# Patient Record
Sex: Male | Born: 1980 | Race: White | Hispanic: No | Marital: Married | Smoking: Former smoker
Health system: Southern US, Community
[De-identification: ages and names within clinical notes are randomized; demographics above are authoritative.]

## PROBLEM LIST (undated history)

## (undated) DIAGNOSIS — I319 Disease of pericardium, unspecified: Secondary | ICD-10-CM

---

## 2010-02-23 ENCOUNTER — Emergency Department: Payer: Self-pay | Admitting: Unknown Physician Specialty

## 2010-05-17 ENCOUNTER — Emergency Department: Payer: Self-pay | Admitting: Emergency Medicine

## 2010-11-13 ENCOUNTER — Emergency Department: Payer: Self-pay | Admitting: Emergency Medicine

## 2011-01-21 ENCOUNTER — Emergency Department: Payer: Self-pay | Admitting: *Deleted

## 2012-01-13 ENCOUNTER — Ambulatory Visit: Payer: Self-pay | Admitting: General Practice

## 2013-04-21 ENCOUNTER — Ambulatory Visit: Payer: Self-pay | Admitting: General Practice

## 2013-06-03 DIAGNOSIS — E221 Hyperprolactinemia: Secondary | ICD-10-CM | POA: Insufficient documentation

## 2013-06-03 DIAGNOSIS — E237 Disorder of pituitary gland, unspecified: Secondary | ICD-10-CM | POA: Insufficient documentation

## 2014-01-19 ENCOUNTER — Emergency Department: Payer: Self-pay | Admitting: Emergency Medicine

## 2014-01-19 LAB — CBC
HCT: 43.4 % (ref 40.0–52.0)
HGB: 14.1 g/dL (ref 13.0–18.0)
MCH: 29.1 pg (ref 26.0–34.0)
MCHC: 32.4 g/dL (ref 32.0–36.0)
MCV: 90 fL (ref 80–100)
Platelet: 211 10*3/uL (ref 150–440)
RBC: 4.84 10*6/uL (ref 4.40–5.90)
RDW: 13.5 % (ref 11.5–14.5)
WBC: 5.8 10*3/uL (ref 3.8–10.6)

## 2014-01-19 LAB — BASIC METABOLIC PANEL
ANION GAP: 8 (ref 7–16)
BUN: 12 mg/dL (ref 7–18)
Calcium, Total: 8.7 mg/dL (ref 8.5–10.1)
Chloride: 106 mmol/L (ref 98–107)
Co2: 26 mmol/L (ref 21–32)
Creatinine: 1.18 mg/dL (ref 0.60–1.30)
EGFR (African American): >60
EGFR (Non-African Amer.): >60
GLUCOSE: 112 mg/dL — AB (ref 65–99)
Osmolality: 280 (ref 275–301)
Potassium: 3.7 mmol/L (ref 3.5–5.1)
SODIUM: 140 mmol/L (ref 136–145)

## 2014-01-19 LAB — TROPONIN I: Troponin-I: <0.02 ng/mL

## 2014-12-09 ENCOUNTER — Emergency Department: Payer: PRIVATE HEALTH INSURANCE

## 2014-12-09 ENCOUNTER — Emergency Department
Admission: EM | Admit: 2014-12-09 | Discharge: 2014-12-10 | Disposition: A | Discharge status: Home / Self Care | Payer: PRIVATE HEALTH INSURANCE | Attending: Student | Admitting: Student

## 2014-12-09 ENCOUNTER — Encounter: Payer: Self-pay | Admitting: Emergency Medicine

## 2014-12-09 DIAGNOSIS — R51 Headache: Secondary | ICD-10-CM

## 2014-12-09 DIAGNOSIS — R519 Headache, unspecified: Secondary | ICD-10-CM

## 2014-12-09 DIAGNOSIS — G43909 Migraine, unspecified, not intractable, without status migrainosus: Secondary | ICD-10-CM | POA: Diagnosis not present

## 2014-12-09 HISTORY — DX: Disease of pericardium, unspecified: I31.9

## 2014-12-09 LAB — BASIC METABOLIC PANEL
ANION GAP: 6 (ref 5–15)
BUN: 13 mg/dL (ref 6–20)
CO2: 27 mmol/L (ref 22–32)
Calcium: 9.1 mg/dL (ref 8.9–10.3)
Chloride: 106 mmol/L (ref 101–111)
Creatinine, Ser: 1.14 mg/dL (ref 0.61–1.24)
GFR calc Af Amer: >60 mL/min (ref >60)
GLUCOSE: 140 mg/dL — AB (ref 65–99)
POTASSIUM: 3.9 mmol/L (ref 3.5–5.1)
Sodium: 139 mmol/L (ref 135–145)

## 2014-12-09 LAB — CBC
HEMATOCRIT: 47.1 % (ref 40.0–52.0)
Hemoglobin: 15.9 g/dL (ref 13.0–18.0)
MCH: 28.9 pg (ref 26.0–34.0)
MCHC: 33.7 g/dL (ref 32.0–36.0)
MCV: 85.7 fL (ref 80.0–100.0)
Platelets: 252 10*3/uL (ref 150–440)
RBC: 5.5 MIL/uL (ref 4.40–5.90)
RDW: 13.4 % (ref 11.5–14.5)
WBC: 6.3 10*3/uL (ref 3.8–10.6)

## 2014-12-09 MED ORDER — DIPHENHYDRAMINE HCL 50 MG/ML IJ SOLN
12.5000 mg | Freq: Once | INTRAMUSCULAR | Status: AC
  Administered 2014-12-09: 12.5 mg via INTRAVENOUS
  Filled 2014-12-09: qty 1

## 2014-12-09 MED ORDER — SODIUM CHLORIDE 0.9 % IV BOLUS (SEPSIS)
1000.0000 mL | Freq: Once | INTRAVENOUS | Status: AC
  Administered 2014-12-09: 1000 mL via INTRAVENOUS

## 2014-12-09 MED ORDER — METOCLOPRAMIDE HCL 5 MG/ML IJ SOLN
10.0000 mg | Freq: Once | INTRAMUSCULAR | Status: AC
  Administered 2014-12-09: 10 mg via INTRAVENOUS
  Filled 2014-12-09: qty 2

## 2014-12-09 MED ORDER — KETOROLAC TROMETHAMINE 30 MG/ML IJ SOLN
30.0000 mg | Freq: Once | INTRAMUSCULAR | Status: AC
  Administered 2014-12-09: 30 mg via INTRAVENOUS
  Filled 2014-12-09: qty 1

## 2014-12-09 NOTE — ED Notes (Signed)
Dr. Gayle at bedside  

## 2014-12-09 NOTE — ED Provider Notes (Signed)
[facility] Emergency Department Provider Note  ____________________________________________  Time seen: Approximately 9:57 PM  I have reviewed the triage vital signs and the nursing notes.   HISTORY  Chief Complaint Headache    HPI Terry Hayes is a 34 y.o. male with history of migraine headaches presented with Imitrex presents for evaluation of 2 days gradual onset diffuse aching headache, intermittent initially but now constant and moderate to severe. Patient reports that in the past when he has had headache this severe he needed to come to the emergency department and be treated with IV medications. 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 He has no numbness, weakness, vision change, slurring of speech, neck pain or neck stiffness, no vomiting or diarrhea fevers or chills. No chest pain or difficulty breathing. No modifying factors.   Past Medical History  Diagnosis Date  . Pericarditis     secondary to flu    There are no active problems to display for this patient.   History reviewed. No pertinent past surgical history.  No current outpatient prescriptions on file.  Allergies Review of patient's allergies indicates no known allergies.  History reviewed. No pertinent family history.  Social History Social History  Substance Use Topics  . Smoking status: Never Smoker   . Smokeless tobacco: None  . Alcohol Use: No    Review of Systems Constitutional: No fever/chills Eyes: No visual changes. ENT: No sore throat. Cardiovascular: Denies chest pain. Respiratory: Denies shortness of breath. Gastrointestinal: No abdominal pain.  No nausea, no vomiting.  No diarrhea.  No constipation. Genitourinary: Negative for dysuria. Musculoskeletal: Negative for back pain. Skin: Negative for  rash. Neurological: Positive for headache, no focal weakness or numbness.  10-point ROS otherwise negative.  ____________________________________________   PHYSICAL EXAM:  VITAL SIGNS: ED Triage Vitals  Enc Vitals Group     BP 12/09/14 2102 148/97 mmHg     Pulse Rate 12/09/14 2102 118     Resp 12/09/14 2102 20     Temp 12/09/14 2102 98.1 F (36.7 C)     Temp Source 12/09/14 2102 Oral     SpO2 12/09/14 2102 98 %     Weight 12/09/14 2102 220 lb (99.791 kg)     Height 12/09/14 2102  (1.702 m)     Head Cir --      Peak Flow --      Pain Score 12/09/14 2104 10     Pain Loc --      Pain Edu? --      Excl. in GC? --     Constitutional: Alert and oriented. Well appearing and in no acute distress. Eyes: Conjunctivae are normal. PERRL. EOMI. Head: Atraumatic. Nose: No congestion/rhinnorhea. Mouth/Throat: Mucous membranes are moist.  Oropharynx non-erythematous. Neck: No stridor.  Neck supple without meningismus. Cardiovascular: Normal rate, regular rhythm. Grossly normal heart sounds.  Good peripheral circulation. Respiratory: Normal respiratory effort.  No retractions. Lungs CTAB. Gastrointestinal: Soft and nontender. No distention.  No CVA tenderness. Genitourinary: deferred Musculoskeletal: No lower extremity tenderness nor edema.  No joint effusions. Neurologic:  Normal speech and language. No gross focal neurologic deficits are appreciated. No gait instability. 5 out of 5 strength in bilateral upper and lower extremities, sensation intact to light touch throughout. Skin:  Skin is warm, dry and intact. No rash noted. Psychiatric: Mood  and affect are normal. Speech and behavior are normal.  ____________________________________________   LABS (all labs ordered are listed, but only abnormal results are displayed)  Labs Reviewed  BASIC METABOLIC PANEL - Abnormal; Notable for the following:    Glucose, Bld 140 (*)    All other components within normal limits  CBC    ____________________________________________  EKG  ED ECG REPORT I, Gayla DossGayle, Kartel Wolbert A, the attending physician, personally viewed and interpreted this ECG.   Date: 12/09/2014  EKG Time: 21:04  Rate: 95  Rhythm: normal EKG, normal sinus rhythm  Axis: normal  Intervals:none  ST&T Change: No acute ST elevation  ____________________________________________  RADIOLOGY  CT head  FINDINGS: There is no intracranial hemorrhage, mass or evidence of acute infarction. There is no extra-axial fluid collection. Gray matter and white matter appear normal. Cerebral volume is normal for age. Brainstem and posterior fossa are unremarkable. The CSF spaces appear normal.  The bony structures are intact. The visible portions of the paranasal sinuses are clear.  IMPRESSION: Normal brain  ____________________________________________   PROCEDURES  Procedure(s) performed: None  Critical Care performed: No  ____________________________________________   INITIAL IMPRESSION / ASSESSMENT AND PLAN / ED COURSE  Pertinent labs & imaging results that were available during my care of the patient were reviewed by me and considered in my medical decision making (see chart for details).  Terry Hayes is a 34 y.o. male with history of migraine headaches presented with Imitrex presents for evaluation of 3 days gradual onset diffuse aching headache, intermittent initially but now constant and moderate to severe. On exam, he is generally well-appearing and in no acute distress. Vital signs notable for mild tachycardia on arrival which has resolved at the time of my examination without any ER intervention. Patient is mildly hypertensive here with a blood pressure 148/97. Neck supple without meningismus, doubt meningitis. Given similarity to prior headaches treated with Imitrex, suspect recurrent migraine-type headache, doubt subarachnoid hemorrhage. We'll treat with migraine cocktail and reassess. Labs  reviewed. Normal CBC and CMP. CT head was obtained given the patient's self report of severe hypertension earlier today and his current headache however CT head is normal. He has an intact neurological examination. Reassess for disposition.  ----------------------------------------- 11:53 PM on 12/09/2014 -----------------------------------------  Patient with significant improvement of headache after Reglan, Toradol, benadryl and fluids. He reports he feels well and is requesting discharge. We discussed return precautions, need for close PCP follow-up and he and his wife at bedside are comfortable with discharge. He'll follow up with his primary care doctor next week for recheck of blood pressure. ____________________________________________   FINAL CLINICAL IMPRESSION(S) / ED DIAGNOSES  Final diagnoses:  Acute nonintractable headache, unspecified headache type      Gayla DossEryka A Lynessa Almanzar, MD 12/09/14 2354

## 2014-12-09 NOTE — ED Notes (Signed)
Pt back in room at this time.

## 2014-12-09 NOTE — ED Notes (Addendum)
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  Pt reports "feeling weird" and nausea denies dizziness/V/D.

## 2014-12-09 NOTE — ED Notes (Signed)
Patient transported to CT 

## 2014-12-26 ENCOUNTER — Ambulatory Visit: Payer: Self-pay | Admitting: Physician Assistant

## 2014-12-26 VITALS — BP 120/70 | HR 85 | Temp 98.5°F

## 2014-12-26 DIAGNOSIS — H1030 Unspecified acute conjunctivitis, unspecified eye: Secondary | ICD-10-CM

## 2014-12-26 DIAGNOSIS — J069 Acute upper respiratory infection, unspecified: Secondary | ICD-10-CM

## 2014-12-26 MED ORDER — TOBRAMYCIN 0.3 % OP SOLN: 2.0000 [drp] | OPHTHALMIC | Status: DC

## 2014-12-26 MED ORDER — AZITHROMYCIN 250 MG PO TABS: ORAL_TABLET | ORAL | Status: DC

## 2014-12-26 NOTE — Progress Notes (Signed)
S:  C/o right eye being irritated, matted, sx started last pm, had cough, congestion last week, denies fever, chills, v/d; remainder ros neg  O: vitals wnl, nad, r pupil more dilated than left; eomi, right eye with injected conjunctiva, no drainage or matting noted at this time, tms clear, nasal mucosa inflamed, throat wnl, neck supple no lymph, lungs c t a, cv rrr  A:  Acute conjunctivitis, unequal pupils  P: tobramycin opth gtts, return if not better in 3 - 5d, if worsening return earlier or see eye doctor, no work until eye is no longer red or draining, sent to eye doctor this morning at [facility] for recheck of pupils

## 2015-02-21 ENCOUNTER — Ambulatory Visit: Payer: Self-pay | Admitting: Physician Assistant

## 2015-02-21 ENCOUNTER — Encounter: Payer: Self-pay | Admitting: Physician Assistant

## 2015-02-21 VITALS — BP 130/70 | HR 112 | Temp 99.2°F

## 2015-02-21 DIAGNOSIS — F419 Anxiety disorder, unspecified: Secondary | ICD-10-CM

## 2015-02-21 DIAGNOSIS — R509 Fever, unspecified: Secondary | ICD-10-CM

## 2015-02-21 DIAGNOSIS — J029 Acute pharyngitis, unspecified: Secondary | ICD-10-CM

## 2015-02-21 LAB — POCT INFLUENZA A/B
INFLUENZA A, POC: NEGATIVE
Influenza B, POC: NEGATIVE

## 2015-02-21 LAB — POCT RAPID STREP A (OFFICE): Rapid Strep A Screen: NEGATIVE

## 2015-02-21 MED ORDER — CITALOPRAM HYDROBROMIDE 20 MG PO TABS: 20.0000 mg | ORAL_TABLET | Freq: Every day | ORAL | Status: DC

## 2015-02-21 MED ORDER — AMOXICILLIN 875 MG PO TABS: 875.0000 mg | ORAL_TABLET | Freq: Two times a day (BID) | ORAL | Status: DC

## 2015-02-21 NOTE — Progress Notes (Signed)
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

## 2015-05-01 ENCOUNTER — Ambulatory Visit: Payer: Self-pay | Admitting: Physician Assistant

## 2015-05-01 ENCOUNTER — Encounter: Payer: Self-pay | Admitting: Physician Assistant

## 2015-05-01 VITALS — BP 130/70 | HR 126 | Temp 98.4°F

## 2015-05-01 DIAGNOSIS — J101 Influenza due to other identified influenza virus with other respiratory manifestations: Secondary | ICD-10-CM

## 2015-05-01 DIAGNOSIS — R509 Fever, unspecified: Secondary | ICD-10-CM

## 2015-05-01 LAB — POCT INFLUENZA A/B
INFLUENZA A, POC: NEGATIVE
INFLUENZA B, POC: POSITIVE — AB

## 2015-05-01 MED ORDER — OSELTAMIVIR PHOSPHATE 75 MG PO CAPS: 75.0000 mg | ORAL_CAPSULE | Freq: Two times a day (BID) | ORAL | Status: AC

## 2015-05-01 MED ORDER — HYDROCOD POLST-CPM POLST ER 10-8 MG/5ML PO SUER: 5.0000 mL | Freq: Two times a day (BID) | ORAL | Status: DC | PRN

## 2015-05-01 NOTE — Progress Notes (Signed)
S: C/o runny nose and congestion with dry cough for 2 days, + fever, chills, body aches, cough; denies cp/sob, v/d; cough is making her ribs hurt   Using otc meds: robitussin  O: PE: vitals wnl, nad,  perrl eomi, normocephalic, tms dull, nasal mucosa red and swollen, throat injected, neck supple no lymph, lungs c t a, cv rrr, neuro intact, flu swab + b  A:  Acute influenza B  P: tamiflu 75mg  bid; tussionex 150ml nr; drink fluids, continue regular meds , use otc meds of choice, return if not improving in 5 days, return earlier if worsening

## 2015-05-14 ENCOUNTER — Emergency Department
Admission: EM | Admit: 2015-05-14 | Discharge: 2015-05-14 | Disposition: A | Discharge status: Home / Self Care | Payer: Managed Care, Other (non HMO) | Attending: Emergency Medicine | Admitting: Emergency Medicine

## 2015-05-14 DIAGNOSIS — H1133 Conjunctival hemorrhage, bilateral: Secondary | ICD-10-CM | POA: Diagnosis present

## 2015-05-14 DIAGNOSIS — Z79899 Other long term (current) drug therapy: Secondary | ICD-10-CM | POA: Insufficient documentation

## 2015-05-14 MED ORDER — FLUORESCEIN SODIUM 1 MG OP STRP
1.0000 | ORAL_STRIP | Freq: Once | OPHTHALMIC | Status: AC
  Administered 2015-05-14: 1 via OPHTHALMIC
  Filled 2015-05-14: qty 1

## 2015-05-14 MED ORDER — TETRACAINE HCL 0.5 % OP SOLN
2.0000 [drp] | Freq: Once | OPHTHALMIC | Status: AC
  Administered 2015-05-14: 2 [drp] via OPHTHALMIC
  Filled 2015-05-14: qty 2

## 2015-05-14 MED ORDER — KETOROLAC TROMETHAMINE 0.5 % OP SOLN: 1.0000 [drp] | Freq: Four times a day (QID) | OPHTHALMIC | Status: DC

## 2015-05-14 NOTE — ED Notes (Signed)
States was working all day, took his sunglasses off and saw he had broken blood vessels in both eyes, pressure in both eyes

## 2015-05-14 NOTE — Discharge Instructions (Signed)
Subconjunctival Hemorrhage °Subconjunctival hemorrhage is bleeding that happens between the white part of your eye (sclera) and the clear membrane that covers the outside of your eye (conjunctiva). There are many tiny blood vessels near the surface of your eye. A subconjunctival hemorrhage happens when one or more of these vessels breaks and bleeds, causing a red patch to appear on your eye. This is similar to a bruise. °Depending on the amount of bleeding, the red patch may only cover a small area of your eye or it may cover the entire visible part of the sclera. If a lot of blood collects under the conjunctiva, there may also be swelling. Subconjunctival hemorrhages do not affect your vision or cause pain, but your eye may feel irritated if there is swelling. Subconjunctival hemorrhages usually do not require treatment, and they disappear on their own within two weeks. °CAUSES °This condition may be caused by: °· Mild trauma, such as rubbing your eye too hard. °· Severe trauma or blunt injuries. °· Coughing, sneezing, or vomiting. °· Straining, such as when lifting a heavy object. °· High blood pressure. °· Recent eye surgery. °· A history of diabetes. °· Certain medicines, especially blood thinners (anticoagulants). °· Other conditions, such as eye tumors, bleeding disorders, or blood vessel abnormalities. °Subconjunctival hemorrhages can happen without an obvious cause.  °SYMPTOMS  °Symptoms of this condition include: °· A bright red or dark red patch on the white part of the eye. °¨ The red area may spread out to cover a larger area of the eye before it goes away. °¨ The red area may turn brownish-yellow before it goes away. °· Swelling. °· Mild eye irritation. °DIAGNOSIS °This condition is diagnosed with a physical exam. If your subconjunctival hemorrhage was caused by trauma, your health care provider may refer you to an eye specialist (ophthalmologist) or another specialist to check for other injuries. You  may have other tests, including: °· An eye exam. °· A blood pressure check. °· Blood tests to check for bleeding disorders. °If your subconjunctival hemorrhage was caused by trauma, X-rays or a CT scan may be done to check for other injuries. °TREATMENT °Usually, no treatment is needed. Your health care provider may recommend eye drops or cold compresses to help with discomfort. °HOME CARE INSTRUCTIONS °· Take over-the-counter and prescription medicines only as directed by your health care provider. °· Use eye drops or cold compresses to help with discomfort as directed by your health care provider. °· Avoid activities, things, and environments that may irritate or injure your eye. °· Keep all follow-up visits as told by your health care provider. This is important. °SEEK MEDICAL CARE IF: °· You have pain in your eye. °· The bleeding does not go away within 3 weeks. °· You keep getting new subconjunctival hemorrhages. °SEEK IMMEDIATE MEDICAL CARE IF: °· Your vision changes or you have difficulty seeing. °· You suddenly develop severe sensitivity to light. °· You develop a severe headache, persistent vomiting, confusion, or abnormal tiredness (lethargy). °· Your eye seems to bulge or protrude from your eye socket. °· You develop unexplained bruises on your body. °· You have unexplained bleeding in another area of your body. °  °This information is not intended to replace advice given to you by your health care provider. Make sure you discuss any questions you have with your health care provider. °  °Document Released: 01/21/2005 Document Revised: 10/12/2014 Document Reviewed: 03/30/2014 °Elsevier Interactive Patient Education ©2016 [employer] ° °

## 2015-05-14 NOTE — ED Notes (Signed)
Patient with no complaints at this time. Respirations even and unlabored. Skin warm/dry. Discharge instructions reviewed with patient at this time. Patient given opportunity to voice concerns/ask questions. Patient discharged at this time and left Emergency Department with steady gait.   

## 2015-05-14 NOTE — ED Provider Notes (Signed)
[facility] Emergency Department Provider Note  ____________________________________________  Time seen: Approximately 7:07 PM  I have reviewed the triage vital signs and the nursing notes.   HISTORY  Chief Complaint Pressure Behind the Eyes    HPI Terry Hayes is a 35 y.o. male who presents emergency department complaining of broken blood vessels to bilateral eyes. Patient states that he will was sick with the flu approximately 2 weeks ago. He has had continual severe coughing since then but states that today was the first day "I feel good again." He states that he was not having any symptoms of allergies or eye irritation. He took his sunglasses off this afternoon and looked in the mirror and saw subconjunctival hemorrhaging in both eyes. He states he is now feeling some mild pressure in both eyes as well as a mild frontal headache. Patient denies any trauma to orbits, head, or eyes. He is not taken any medications prior to arrival. Patient does wear contacts.   Past Medical History  Diagnosis Date  . Pericarditis     secondary to flu    Patient Active Problem List   Diagnosis Date Noted  . Hyperprolactinemia (HCC) 06/03/2013  . Disease of pituitary gland (HCC) 06/03/2013    No past surgical history on file.  Current Outpatient Rx  Name  Route  Sig  Dispense  Refill  . chlorpheniramine-HYDROcodone (TUSSIONEX PENNKINETIC ER) 10-8 MG/5ML SUER   Oral   Take 5 mLs by mouth every 12 (twelve) hours as needed for cough.   150 mL   0     rx written   . citalopram (CELEXA) 20 MG tablet   Oral   Take 1 tablet (20 mg total) by mouth daily.   30 tablet   3   . ketorolac (ACULAR) 0.5 % ophthalmic solution   Right Eye   Place 1 drop into the right eye 4 (four) times daily.   5 mL   0     Allergies Erythromycin  No family history on file.  Social History Social History  Substance Use Topics  . Smoking status: Never Smoker   . Smokeless  tobacco: Not on file  . Alcohol Use: No     Review of Systems  Constitutional: No fever/chills Eyes: No visual changes. No discharge. Positive for subconjunctival hemorrhaging bilateral eyes ENT: No sore throat. Denies nasal congestion. Denies ear pain. Cardiovascular: no chest pain. Respiratory: no cough. No SOB. Skin: Negative for rash. Neurological: Positive for headache but denies focal weakness or numbness. 10-point ROS otherwise negative.  ____________________________________________   PHYSICAL EXAM:  VITAL SIGNS: ED Triage Vitals  Enc Vitals Group     BP 05/14/15 1835 125/85 mmHg     Pulse Rate 05/14/15 1831 93     Resp 05/14/15 1831 18     Temp --      Temp src --      SpO2 05/14/15 1831 98 %     Weight 05/14/15 1831 210 lb (95.255 kg)     Height 05/14/15 1831  (1.727 m)     Head Cir --      Peak Flow --      Pain Score --      Pain Loc --      Pain Edu? --      Excl. in GC? --      Constitutional: Alert and oriented. Well appearing and in no acute distress. Eyes: Conjunctivae With significant subconjunctival hemorrhaging in the lower  half of bilateral eyes. No visible foreign body. PERRL. EOMI. visual acuity screening reveals 20/20 vision with both eyes, 20/25 left, 20/30 right. funduscopic exam reveals normal vasculature.Optic disc is visualized bilaterally with no edema. Tonometry readings are as follows; right eye: 10, 15, 13, 12. Left: 6, 8, 12, 8. Floor seen staining reveals no areas of uptake.  Head: Atraumatic. ENT:      Ears:       Nose: No congestion/rhinnorhea.      Mouth/Throat: Mucous membranes are moist.  Neck: No stridor.   Hematological/Lymphatic/Immunilogical: No cervical lymphadenopathy. Cardiovascular: Normal rate, regular rhythm. Normal S1 and S2.  Good peripheral circulation. Respiratory: Normal respiratory effort without tachypnea or retractions. Lungs CTAB. Neurologic:  Normal speech and language. No gross focal neurologic  deficits are appreciated.  Skin:  Skin is warm, dry and intact. No rash noted. Psychiatric: Mood and affect are normal. Speech and behavior are normal. Patient exhibits appropriate insight and judgement.   ____________________________________________   LABS (all labs ordered are listed, but only abnormal results are displayed)  Labs Reviewed - No data to display ____________________________________________  EKG   ____________________________________________  RADIOLOGY   No results found.  ____________________________________________    PROCEDURES  Procedure(s) performed:       Medications  fluorescein ophthalmic strip 1 strip (1 strip Both Eyes Given 05/14/15 1915)  tetracaine (PONTOCAINE) 0.5 % ophthalmic solution 2 drop (2 drops Both Eyes Given 05/14/15 1915)     ____________________________________________   INITIAL IMPRESSION / ASSESSMENT AND PLAN / ED COURSE  Pertinent labs & imaging results that were available during my care of the patient were reviewed by me and considered in my medical decision making (see chart for details).  Patient's diagnosis is consistent with subconjunctival hemorrhaging. Exam is reassuring.. Patient will be discharged home with prescriptions for Acular for symptom control. Patient is advised to follow-up with ophthalmology in the morning. If any symptoms should change or worsen patient is instructed to come back to the emergency department..    ____________________________________________  FINAL CLINICAL IMPRESSION(S) / ED DIAGNOSES  Final diagnoses:  Subconjunctival hemorrhage of both eyes      NEW MEDICATIONS STARTED DURING THIS VISIT:  New Prescriptions   KETOROLAC (ACULAR) 0.5 % OPHTHALMIC SOLUTION    Place 1 drop into the right eye 4 (four) times daily.        This chart was dictated using voice recognition software/Dragon. Despite best efforts to proofread, errors can occur which can change the meaning. Any  change was purely unintentional.    Racheal PatchesJonathan D Darshan Solanki, PA-C 05/14/15 1940  Sharman CheekPhillip Stafford, MD 05/15/15 2351

## 2015-05-15 ENCOUNTER — Ambulatory Visit: Payer: Self-pay | Admitting: Physician Assistant

## 2015-05-15 ENCOUNTER — Encounter: Payer: Self-pay | Admitting: Physician Assistant

## 2015-05-15 VITALS — BP 130/70 | HR 98 | Temp 97.6°F

## 2015-05-15 DIAGNOSIS — H1133 Conjunctival hemorrhage, bilateral: Secondary | ICD-10-CM

## 2015-05-15 DIAGNOSIS — R05 Cough: Secondary | ICD-10-CM

## 2015-05-15 DIAGNOSIS — R059 Cough, unspecified: Secondary | ICD-10-CM

## 2015-05-15 MED ORDER — METHYLPREDNISOLONE 4 MG PO TBPK: ORAL_TABLET | ORAL | Status: AC

## 2015-05-15 MED ORDER — BENZONATATE 200 MG PO CAPS: 200.0000 mg | ORAL_CAPSULE | Freq: Two times a day (BID) | ORAL | Status: DC | PRN

## 2015-05-15 NOTE — Progress Notes (Signed)
S: seen in ER for subconjunctival hemorrhage, states he has been coughing really bad due to flu, states fever/chills have gone away, mucus is mostly clear, denies headache, eye pain, cp/sob, v/d  O: vitals wnl, nad, perrl eomi, conjunctiva with hemorrhages b/l, throat wnl, neck supple no lymph, lungs c t a, cv rrr  A: acute subconjunctival hemorrhage, cough  P: reassurance, tessalon perls for cough, medrol dose pack for bronchial irritation

## 2015-06-05 ENCOUNTER — Ambulatory Visit: Payer: Self-pay | Admitting: Registered Nurse

## 2015-06-05 VITALS — BP 118/80 | HR 91 | Temp 98.6°F

## 2015-06-05 DIAGNOSIS — J302 Other seasonal allergic rhinitis: Secondary | ICD-10-CM

## 2015-06-05 DIAGNOSIS — H6591 Unspecified nonsuppurative otitis media, right ear: Secondary | ICD-10-CM

## 2015-06-05 DIAGNOSIS — H6123 Impacted cerumen, bilateral: Secondary | ICD-10-CM

## 2015-06-05 MED ORDER — SALINE SPRAY 0.65 % NA SOLN: 1.0000 | NASAL | Status: DC | PRN

## 2015-06-05 MED ORDER — LORATADINE 10 MG PO TABS: 10.0000 mg | ORAL_TABLET | Freq: Every day | ORAL | Status: DC

## 2015-06-05 MED ORDER — FLUTICASONE PROPIONATE 50 MCG/ACT NA SUSP: 2.0000 | Freq: Every day | NASAL | Status: DC

## 2015-06-05 NOTE — Progress Notes (Signed)
Subjective:    Patient ID: Terry Hayes, male    DOB: October 07, 1980, 35 y.o.   MRN: [id number]  HPI Comments: Married caucasian male works for [employer] here for ear wax removal "having trouble hearing" PMHx seasonal allergies, cerumen impaction  "[name] clears out my ears on a regular basis when hearing decreased" per patient  Had viral illness earlier this month per patient.     Review of Systems  Constitutional: Negative for fever, chills, diaphoresis, activity change, appetite change, fatigue and unexpected weight change.  HENT: Positive for congestion, ear pain, postnasal drip, sinus pressure and sore throat. Negative for dental problem, drooling, ear discharge, facial swelling, hearing loss, mouth sores, nosebleeds, rhinorrhea, sneezing, tinnitus, trouble swallowing and voice change.   Eyes: Negative for photophobia, pain, discharge, redness, itching and visual disturbance.  Respiratory: Negative for cough, choking, chest tightness, shortness of breath, wheezing and stridor.   Cardiovascular: Negative for chest pain, palpitations and leg swelling.  Gastrointestinal: Negative for nausea, vomiting, abdominal pain, diarrhea, constipation, blood in stool and abdominal distention.  Endocrine: Negative for cold intolerance and heat intolerance.  Genitourinary: Negative for dysuria.  Musculoskeletal: Negative for myalgias, back pain, joint swelling, arthralgias, gait problem, neck pain and neck stiffness.  Skin: Negative for color change, pallor, rash and wound.  Allergic/Immunologic: Positive for environmental allergies. Negative for food allergies and immunocompromised state.  Neurological: Negative for dizziness, tremors, seizures, syncope, facial asymmetry, speech difficulty, weakness, light-headedness, numbness and headaches.  Hematological: Negative for adenopathy. Does not bruise/bleed easily.  Psychiatric/Behavioral: Negative for behavioral problems, confusion, sleep disturbance  and agitation.       Objective:   Physical Exam  Constitutional: He is oriented to person, place, and time. Vital signs are normal. He appears well-developed and well-nourished. He is active and cooperative.  Non-toxic appearance. He does not have a sickly appearance. He does not appear ill. No distress.  HENT:  Head: Normocephalic and atraumatic.  Right Ear: Hearing, external ear and ear canal normal. A middle ear effusion is present.  Left Ear: Hearing, external ear and ear canal normal.  Nose: Mucosal edema and rhinorrhea present. No nose lacerations, sinus tenderness, nasal deformity, septal deviation or nasal septal hematoma. No epistaxis.  No foreign bodies. Right sinus exhibits no maxillary sinus tenderness and no frontal sinus tenderness. Left sinus exhibits no maxillary sinus tenderness and no frontal sinus tenderness.  Mouth/Throat: Uvula is midline and mucous membranes are normal. Mucous membranes are not pale, not dry and not cyanotic. He does not have dentures. No oral lesions. No trismus in the jaw. Normal dentition. No dental abscesses, uvula swelling, lacerations or dental caries. Posterior oropharyngeal edema and posterior oropharyngeal erythema present. No oropharyngeal exudate or tonsillar abscesses.  Bilateral external auditory canals with 100% occlusion soft brown yellow wax utilized curettage to remove 75% ear wax patient reported improved hearing left and minimal improvement right hearing; bilateral TMs with adherent wax ordered ear irrigation by CMA Catha Brow performed and wax cleared right TM on reinspection but middle ear with air fluid level slight opacity; unable to visualize left middle ear as wax remained adherent to TM; cobblestoning posterior pharynx; bilateral nasal turbinates edema/erythema  Eyes: Conjunctivae, EOM and lids are normal. Pupils are equal, round, and reactive to light. Right eye exhibits no chemosis, no discharge, no exudate and no hordeolum. No  foreign body present in the right eye. Left eye exhibits no chemosis, no discharge, no exudate and no hordeolum. No foreign body present in the left  eye. Right conjunctiva is not injected. Right conjunctiva has no hemorrhage. Left conjunctiva is not injected. Left conjunctiva has no hemorrhage. No scleral icterus. Right eye exhibits normal extraocular motion and no nystagmus. Left eye exhibits normal extraocular motion and no nystagmus. Right pupil is round and reactive. Left pupil is round and reactive. Pupils are equal.  Neck: Trachea normal and normal range of motion. Neck supple. No tracheal tenderness, no spinous process tenderness and no muscular tenderness present. No rigidity. No tracheal deviation, no edema, no erythema and normal range of motion present. No thyroid mass and no thyromegaly present.  Cardiovascular: Normal rate, regular rhythm, S1 normal, S2 normal, normal heart sounds and intact distal pulses.  PMI is not displaced.  Exam reveals no gallop and no friction rub.   No murmur heard. Pulmonary/Chest: Effort normal and breath sounds normal. No stridor. No respiratory distress. He has no decreased breath sounds. He has no wheezes. He has no rhonchi. He has no rales.  Abdominal: Soft. He exhibits no distension.  Musculoskeletal: Normal range of motion. He exhibits no edema or tenderness.  Lymphadenopathy:       Head (right side): No submental, no submandibular, no tonsillar, no preauricular, no posterior auricular and no occipital adenopathy present.       Head (left side): No submental, no submandibular, no tonsillar, no preauricular, no posterior auricular and no occipital adenopathy present.    He has no cervical adenopathy.       Right cervical: No superficial cervical, no deep cervical and no posterior cervical adenopathy present.      Left cervical: No superficial cervical, no deep cervical and no posterior cervical adenopathy present.  Neurological: He is alert and oriented to  person, place, and time. He displays no atrophy and no tremor. No cranial nerve deficit or sensory deficit. He exhibits normal muscle tone. He displays no seizure activity. Coordination and gait normal. GCS eye subscore is 4. GCS verbal subscore is 5. GCS motor subscore is 6.  Skin: Skin is warm, dry and intact. No abrasion, no bruising, no burn, no ecchymosis, no laceration, no lesion, no petechiae and no rash noted. He is not diaphoretic. No cyanosis or erythema. No pallor. Nails show no clubbing.  Psychiatric: He has a normal mood and affect. His speech is normal and behavior is normal. Judgment and thought content normal. Cognition and memory are normal.  Nursing note and vitals reviewed.         Assessment & Plan:  A- cerumen impaction bilaterally; right otitis media with effusion, seasonal allergic rhinitis  P-Patient reported slight discomfort external ear canal after procedure curretage extraction  Bilateral ears and right ear irrigation by CMA Monique Deacon, minor redness noted right ear 100% clearing cerumen, left TM still with soft brown wax adherent to TM; right TM intact without erythema air fluid level middle ear noted slight opacity.  Patient reported sounds are louder now.  Try mineral oil on cotton ball (soaked) weekly in ear for 20 minutes and when in clinic have provider check ears to see if buildup with this method again.  Discussed ear muff wear with loud noise exposure/yard work.  Discussed purpose of earwax with patient.  Avoid cotton applicator (Q-tip) use in ears. Patient exposed to loud noises at work and unable to wear hearing protection in all circumstances due to work safety issues.  Patient verbalized understanding, agreed with plan of care and had no further questions at this time.    Patient may use normal  saline nasal spray as needed.  Consider OTC antihistamine of choice or nasal steroid flonase 1 spray each nostril BID use.  Avoid triggers if possible.  Shower prior  to bedtime if exposed to triggers.  If allergic dust/dust mites recommend mattress/pillow covers/encasements; washing linens, vacuuming, sweeping, dusting weekly.  Call or return to clinic as needed if these symptoms worsen or fail to improve as anticipated.    Patient verbalized understanding of instructions, agreed with plan of care and had no further questions at this time.  P2:  Avoidance and hand washing.  Supportive treatment.   No evidence of invasive bacterial infection, non toxic and well hydrated.  This is most likely self limiting viral infection.  I do not see where any further testing or imaging is necessary at this time.   I will suggest supportive care, rest, good hygiene and encourage the patient to take adequate fluids.  The patient is to return to clinic or EMERGENCY ROOM if symptoms worsen or change significantly e.g. ear pain, fever, purulent discharge from ears or bleeding.  Patient verbalized agreement and understanding of treatment plan.

## 2015-06-14 ENCOUNTER — Ambulatory Visit: Payer: Self-pay | Admitting: Physician Assistant

## 2015-06-14 ENCOUNTER — Encounter: Payer: Self-pay | Admitting: Physician Assistant

## 2015-06-14 VITALS — BP 120/80 | HR 94 | Temp 97.9°F

## 2015-06-14 DIAGNOSIS — H6123 Impacted cerumen, bilateral: Secondary | ICD-10-CM

## 2015-06-14 NOTE — Progress Notes (Signed)
S: having difficulty hearing, no fever/chills/pain, no drainage from ears  O: vitals wnl, nad, both ear canals with wax , cannot see tms; lungs c t a, cv rrr , irrigated ears, wax not moving  A: cerumen impaction  P: use otc debrox, return on Friday for ear irrigation

## 2015-06-16 ENCOUNTER — Ambulatory Visit: Payer: Self-pay | Admitting: Physician Assistant

## 2015-06-16 ENCOUNTER — Encounter: Payer: Self-pay | Admitting: Physician Assistant

## 2015-06-16 VITALS — BP 120/80 | HR 100 | Temp 98.7°F

## 2015-06-16 DIAGNOSIS — H6501 Acute serous otitis media, right ear: Secondary | ICD-10-CM

## 2015-06-16 MED ORDER — METHYLPREDNISOLONE 4 MG PO TBPK: ORAL_TABLET | ORAL | Status: DC

## 2015-06-16 MED ORDER — AMOXICILLIN-POT CLAVULANATE 875-125 MG PO TABS: 1.0000 | ORAL_TABLET | Freq: Two times a day (BID) | ORAL | Status: DC

## 2015-06-16 NOTE — Progress Notes (Signed)
S: still can't hear out of r ear, used debrox for 2 days, no other complaints  O: vitals wnl, nad, irrigated both ear canals, small amount of wax removed, some remains but able to see tm on r which is red and filled with fluid, neck supple no lymph, lungs c t a, cv rrr  A: acute otits media, cerumen impaction/removal, decreased hearing  P: augmentin, medrol dose pack, continue debrox, f/u with ENT if unable to hear better by Mon or Tues of next week

## 2015-07-11 ENCOUNTER — Other Ambulatory Visit: Payer: Self-pay | Admitting: Emergency Medicine

## 2015-07-11 DIAGNOSIS — F419 Anxiety disorder, unspecified: Secondary | ICD-10-CM

## 2015-07-11 NOTE — Telephone Encounter (Signed)
Patient called and requested a refill for his Ampicillin and Celexa to be sent to [facility] [place].

## 2015-07-12 MED ORDER — AMPICILLIN 500 MG PO CAPS: 500.0000 mg | ORAL_CAPSULE | Freq: Every day | ORAL | Status: DC

## 2015-07-12 MED ORDER — CITALOPRAM HYDROBROMIDE 20 MG PO TABS: 20.0000 mg | ORAL_TABLET | Freq: Every day | ORAL | Status: DC

## 2015-07-12 NOTE — Telephone Encounter (Signed)
Med refill approved 

## 2015-11-21 ENCOUNTER — Encounter: Payer: Self-pay | Admitting: Physician Assistant

## 2015-11-21 ENCOUNTER — Ambulatory Visit: Payer: Self-pay | Admitting: Physician Assistant

## 2015-11-21 DIAGNOSIS — M79604 Pain in right leg: Secondary | ICD-10-CM

## 2015-11-21 MED ORDER — METHYLPREDNISOLONE 4 MG PO TBPK: ORAL_TABLET | ORAL | 0 refills | Status: DC

## 2015-11-21 NOTE — Progress Notes (Signed)
S: c/o r ankle and leg pain, states its a sharp shooting pain, achilles is sore also, no known injury, started last night, area isn't red or swollen, pain radiates up lateral side of calf into anterior thigh, has been doing a high protein diet to lose weight  O: vitals wnl, nad, skin intact, no redness or swelling, some tenderness over lateral malleolus, full rom, achilles intact, n/v iintact  A: tendonitis, ? Neuralgia type pain  P: medrol dose pack, ice , elevated, if not better in a few days with steroids, will refer to ortho

## 2016-06-13 ENCOUNTER — Other Ambulatory Visit: Payer: Self-pay | Admitting: Physician Assistant

## 2016-06-13 DIAGNOSIS — F419 Anxiety disorder, unspecified: Secondary | ICD-10-CM

## 2016-06-13 MED ORDER — CITALOPRAM HYDROBROMIDE 20 MG PO TABS: 20.0000 mg | ORAL_TABLET | Freq: Every day | ORAL | 3 refills | Status: DC

## 2016-06-13 NOTE — Progress Notes (Unsigned)
Got a rx request error, pharmacy was trying to send medication as seth Kallal, not [place] seth Siegenthaler, sent rx for citalopram to [facility]

## 2016-07-15 ENCOUNTER — Other Ambulatory Visit: Payer: Self-pay

## 2016-07-15 VITALS — BP 110/70 | HR 90 | Temp 98.5°F | Resp 16 | Ht 67.0 in | Wt 244.0 lb

## 2016-07-15 DIAGNOSIS — Z008 Encounter for other general examination: Secondary | ICD-10-CM

## 2016-07-15 DIAGNOSIS — Z0189 Encounter for other specified special examinations: Secondary | ICD-10-CM

## 2016-07-15 DIAGNOSIS — Z0000 Encounter for general adult medical examination without abnormal findings: Secondary | ICD-10-CM

## 2016-07-15 MED ORDER — AMPICILLIN 500 MG PO CAPS: 500.0000 mg | ORAL_CAPSULE | Freq: Every day | ORAL | 6 refills | Status: DC

## 2016-07-15 NOTE — Progress Notes (Signed)
S: pt here for wellness physical and biometrics for insurance purposes, no complaints ros neg. PMH: pituitary disease, anxiety  Social: former smoker, no etoh, no drugs, married with 6 children  Fam: mother recently diagnosed with cirrhosis but was not a drinker; father is healthy, sister has ulcerative colitis  O: vitals wnl, nad, ENT wnl, neck supple no lymph, lungs c t a, cv rrr, abd soft nontender bs normal all 4 quads  A: wellness, biometric physical  P: labs today, refill on ampicillin for acne, f/u prn

## 2016-07-16 LAB — CMP12+LP+TP+TSH+6AC+CBC/D/PLT
ALT: 32 IU/L (ref 0–44)
AST: 22 IU/L (ref 0–40)
Albumin/Globulin Ratio: 1.5 (ref 1.2–2.2)
Albumin: 4.6 g/dL (ref 3.5–5.5)
Alkaline Phosphatase: 58 IU/L (ref 39–117)
BASOS ABS: 0 10*3/uL (ref 0.0–0.2)
BUN / CREAT RATIO: 16 (ref 9–20)
BUN: 17 mg/dL (ref 6–20)
Basos: 1 %
Bilirubin Total: 1.1 mg/dL (ref 0.0–1.2)
CALCIUM: 9.7 mg/dL (ref 8.7–10.2)
CHLORIDE: 101 mmol/L (ref 96–106)
CHOL/HDL RATIO: 4.9 ratio (ref 0.0–5.0)
CREATININE: 1.06 mg/dL (ref 0.76–1.27)
Cholesterol, Total: 251 mg/dL — ABNORMAL HIGH (ref 100–199)
EOS (ABSOLUTE): 0.1 10*3/uL (ref 0.0–0.4)
EOS: 2 %
ESTIMATED CHD RISK: 1 times avg. (ref 0.0–1.0)
Free Thyroxine Index: 1.8 (ref 1.2–4.9)
GFR, EST AFRICAN AMERICAN: 103 mL/min/{1.73_m2} (ref >59)
GFR, EST NON AFRICAN AMERICAN: 89 mL/min/{1.73_m2} (ref >59)
GGT: 61 IU/L (ref 0–65)
GLUCOSE: 104 mg/dL — AB (ref 65–99)
Globulin, Total: 3 g/dL (ref 1.5–4.5)
HDL: 51 mg/dL (ref >39)
Hematocrit: 44.4 % (ref 37.5–51.0)
Hemoglobin: 14.8 g/dL (ref 13.0–17.7)
IRON: 106 ug/dL (ref 38–169)
Immature Grans (Abs): 0 10*3/uL (ref 0.0–0.1)
Immature Granulocytes: 0 %
LDH: 178 IU/L (ref 121–224)
LDL Calculated: 140 mg/dL — ABNORMAL HIGH (ref 0–99)
LYMPHS ABS: 2.1 10*3/uL (ref 0.7–3.1)
Lymphs: 46 %
MCH: 28.7 pg (ref 26.6–33.0)
MCHC: 33.3 g/dL (ref 31.5–35.7)
MCV: 86 fL (ref 79–97)
MONOCYTES: 6 %
Monocytes Absolute: 0.3 10*3/uL (ref 0.1–0.9)
NEUTROS ABS: 2.1 10*3/uL (ref 1.4–7.0)
Neutrophils: 45 %
PHOSPHORUS: 4.1 mg/dL (ref 2.5–4.5)
PLATELETS: 229 10*3/uL (ref 150–379)
POTASSIUM: 4.5 mmol/L (ref 3.5–5.2)
RBC: 5.16 x10E6/uL (ref 4.14–5.80)
RDW: 14.2 % (ref 12.3–15.4)
Sodium: 140 mmol/L (ref 134–144)
T3 UPTAKE RATIO: 27 % (ref 24–39)
T4 TOTAL: 6.7 ug/dL (ref 4.5–12.0)
TOTAL PROTEIN: 7.6 g/dL (ref 6.0–8.5)
TSH: 1.79 u[IU]/mL (ref 0.450–4.500)
Triglycerides: 300 mg/dL — ABNORMAL HIGH (ref 0–149)
URIC ACID: 8 mg/dL (ref 3.7–8.6)
VLDL Cholesterol Cal: 60 mg/dL — ABNORMAL HIGH (ref 5–40)
WBC: 4.6 10*3/uL (ref 3.4–10.8)

## 2016-07-16 LAB — HCV COMMENT:

## 2016-07-16 LAB — PROLACTIN: PROLACTIN: 8 ng/mL (ref 4.0–15.2)

## 2016-07-16 LAB — VITAMIN D 25 HYDROXY (VIT D DEFICIENCY, FRACTURES): VIT D 25 HYDROXY: 20.9 ng/mL — AB (ref 30.0–100.0)

## 2016-07-16 LAB — HEPATITIS C ANTIBODY (REFLEX): HCV Ab: <0.1 s/co ratio (ref 0.0–0.9)

## 2016-07-17 LAB — SPECIMEN STATUS REPORT

## 2016-07-17 LAB — HGB A1C W/O EAG: HEMOGLOBIN A1C: 5.9 % — AB (ref 4.8–5.6)

## 2016-07-17 MED ORDER — VITAMIN D (ERGOCALCIFEROL) 1.25 MG (50000 UNIT) PO CAPS: 50000.0000 [IU] | ORAL_CAPSULE | ORAL | 3 refills | Status: DC

## 2016-07-17 NOTE — Addendum Note (Signed)
Addended by: Faythe GheeFISHER, SUSAN W on: 07/17/2016 08:29 AM   Modules accepted: Orders

## 2016-07-18 ENCOUNTER — Ambulatory Visit: Payer: Self-pay | Admitting: Physician Assistant

## 2016-08-26 ENCOUNTER — Other Ambulatory Visit: Payer: Self-pay | Admitting: Physician Assistant

## 2016-08-26 DIAGNOSIS — F419 Anxiety disorder, unspecified: Secondary | ICD-10-CM

## 2016-10-02 ENCOUNTER — Ambulatory Visit: Payer: Self-pay | Admitting: Physician Assistant

## 2016-10-02 ENCOUNTER — Encounter: Payer: Self-pay | Admitting: Physician Assistant

## 2016-10-02 VITALS — BP 120/85 | HR 80 | Temp 98.5°F | Resp 16 | Wt 247.0 lb

## 2016-10-02 DIAGNOSIS — K219 Gastro-esophageal reflux disease without esophagitis: Secondary | ICD-10-CM

## 2016-10-02 DIAGNOSIS — Z6835 Body mass index (BMI) 35.0-35.9, adult: Principal | ICD-10-CM

## 2016-10-02 MED ORDER — OMEPRAZOLE 20 MG PO CPDR: 20.0000 mg | DELAYED_RELEASE_CAPSULE | Freq: Every day | ORAL | 3 refills | Status: DC

## 2016-10-02 NOTE — Progress Notes (Signed)
S: c/o reflux, thinks its because he has put weight back on, is weighing in at 247lbs, is really concerned bc as he loses weight he will put it back on as soon as "lays off" a strict diet, has tried multiple diets over the past 7 years and has not been successful in keeping it off, is considering getting a bariatric surgery done  O: vitals wnl, nad, weight at 247lb, lungs  t a, cv rrr  A: gerd, obesity  P: omeprazole, agree that it might be best for him to see bariatric clinic

## 2016-11-13 DIAGNOSIS — K219 Gastro-esophageal reflux disease without esophagitis: Secondary | ICD-10-CM | POA: Insufficient documentation

## 2016-11-13 DIAGNOSIS — Z6841 Body Mass Index (BMI) 40.0 and over, adult: Secondary | ICD-10-CM

## 2016-11-14 ENCOUNTER — Encounter: Payer: Self-pay | Admitting: Physician Assistant

## 2016-11-14 ENCOUNTER — Ambulatory Visit: Payer: Self-pay | Admitting: Physician Assistant

## 2016-11-14 ENCOUNTER — Ambulatory Visit
Admission: RE | Admit: 2016-11-14 | Discharge: 2016-11-14 | Disposition: A | Discharge status: Home / Self Care | Payer: Managed Care, Other (non HMO) | Source: Ambulatory Visit | Attending: Physician Assistant | Admitting: Physician Assistant

## 2016-11-14 VITALS — BP 120/70 | HR 104 | Temp 98.5°F | Resp 16

## 2016-11-14 DIAGNOSIS — K219 Gastro-esophageal reflux disease without esophagitis: Secondary | ICD-10-CM | POA: Diagnosis not present

## 2016-11-14 DIAGNOSIS — Z01818 Encounter for other preprocedural examination: Secondary | ICD-10-CM

## 2016-11-14 DIAGNOSIS — E669 Obesity, unspecified: Secondary | ICD-10-CM | POA: Insufficient documentation

## 2016-11-14 NOTE — Progress Notes (Signed)
S: has lab orders and needs an ekg and cxr for preop evaluation by the bariatric surgeon, is planning on having gastric bypass due to his difficulty with losing and gaining weight, denies cp/sob/v/d  O: vitals w mildly elevated pulse, lungs c t a, cv rrr  A: obesity, preop for bariatric surgery  P: will perform all labs, cxr, and ekg to be sent to his bariatric doctor

## 2016-11-26 DIAGNOSIS — R7303 Prediabetes: Secondary | ICD-10-CM | POA: Insufficient documentation

## 2016-11-26 DIAGNOSIS — E7800 Pure hypercholesterolemia, unspecified: Secondary | ICD-10-CM | POA: Insufficient documentation

## 2016-11-26 NOTE — Addendum Note (Signed)
Addended by: Catha BrowEACON, MONIQUE T on: 11/26/2016 09:16 AM   Modules accepted: Orders

## 2017-01-02 ENCOUNTER — Encounter: Payer: Self-pay | Admitting: Family

## 2017-01-02 ENCOUNTER — Ambulatory Visit: Payer: Self-pay | Admitting: Family

## 2017-01-02 VITALS — BP 120/70 | HR 96 | Temp 98.5°F | Resp 16

## 2017-01-02 DIAGNOSIS — G4730 Sleep apnea, unspecified: Secondary | ICD-10-CM

## 2017-01-02 NOTE — Progress Notes (Signed)
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 Needs a  referral for sleep study. He reports snoring, his wife notes that he stops breathing during the night, he has extreme daytime somnolence .Recently on patrol was so sleepy that he had to call his supervisor to come pick him up. His neck size is 18-1/2 O/ malampatti 2 , enlarged tonsils  A /symptoms of sleep apnea  P / referral for sleep study indicated.

## 2017-03-11 ENCOUNTER — Ambulatory Visit (INDEPENDENT_AMBULATORY_CARE_PROVIDER_SITE_OTHER): Payer: Managed Care, Other (non HMO) | Admitting: Physician Assistant

## 2017-03-11 ENCOUNTER — Encounter: Payer: Self-pay | Admitting: Physician Assistant

## 2017-03-11 VITALS — BP 120/80 | HR 80 | Temp 98.4°F | Resp 16 | Ht 67.0 in | Wt 247.8 lb

## 2017-03-11 DIAGNOSIS — Z713 Dietary counseling and surveillance: Secondary | ICD-10-CM | POA: Diagnosis not present

## 2017-03-11 DIAGNOSIS — E6609 Other obesity due to excess calories: Secondary | ICD-10-CM | POA: Diagnosis not present

## 2017-03-11 DIAGNOSIS — Z7689 Persons encountering health services in other specified circumstances: Secondary | ICD-10-CM | POA: Diagnosis not present

## 2017-03-11 DIAGNOSIS — Z6838 Body mass index (BMI) 38.0-38.9, adult: Secondary | ICD-10-CM | POA: Diagnosis not present

## 2017-03-11 MED ORDER — PHENTERMINE HCL 37.5 MG PO TABS: 37.5000 mg | ORAL_TABLET | Freq: Every day | ORAL | 0 refills | Status: DC

## 2017-03-11 NOTE — Patient Instructions (Signed)

## 2017-03-11 NOTE — Progress Notes (Signed)
Patient: Terry Hayes Male    DOB: [birth year]   37 y.o.   MRN: [id number] Visit Date: 03/11/2017  Today's Provider: Margaretann Loveless, PA-C   Chief Complaint  Patient presents with  . Establish Care   Subjective:    HPI Patient here today to establish care. Patient was being seen at [facility] employee clinic by Greig Right, PA-C. Patient here today requesting refill on medications. Patient reports taking Citalopram 20 mg daily and has been on medication for about 2 years.  Patient also requesting medication to help with weight loss. Patient reports that he did consult Dr. Smitty Cords for bariatric surgery. Patient reports changing eating habits and reports that he has lost some weight. Patient reports he is not exercising. Patient reports that he was very active when he was younger. Patient reports that after he gained weight he has been afraid to cause more damage to his knees. He has a goal weight of 180 pounds.   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 He is married with 5 children and one on the way.     Allergies  Allergen Reactions  . Erythromycin Other (See Comments)    Severe stomach pains as a child     Current Outpatient Medications:  .  citalopram (CELEXA) 20 MG tablet, TAKE 1 TABLET (20 MG TOTAL) BY MOUTH DAILY., Disp: 30 tablet, Rfl: 5 .  omeprazole (PRILOSEC) 20 MG capsule, Take 1 capsule (20 mg total) by mouth daily., Disp: 30 capsule, Rfl: 3  Review of Systems  Constitutional: Negative.   Respiratory: Negative.   Cardiovascular: Negative.   Gastrointestinal: Negative.   Neurological: Negative.   Psychiatric/Behavioral: Negative.     Social History   Tobacco Use  . Smoking status: Former Smoker    Last attempt to quit: 07/15/2001    Years since quitting: 15.6  . Smokeless tobacco: Never Used  Substance Use Topics  . Alcohol use: No   Objective:   BP 120/80   Pulse 80   Temp 98.4 F (36.9 C)   Resp 16   Ht 5\' 7"  (1.702 m)    Wt 247 lb 12.8 oz (112.4 kg)   SpO2 98%   BMI 38.81 kg/m  Vitals:   03/11/17 1010  BP: 120/80  Pulse: 80  Resp: 16  Temp: 98.4 F (36.9 C)  SpO2: 98%  Weight: 247 lb 12.8 oz (112.4 kg)  Height: 5\' 7"  (1.702 m)     Physical Exam  Constitutional: He is oriented to person, place, and time. He appears well-developed and well-nourished.  HENT:  Head: Normocephalic and atraumatic.  Right Ear: External ear normal.  Left Ear: External ear normal.  Nose: Nose normal.  Mouth/Throat: Oropharynx is clear and moist.  Eyes: Conjunctivae and EOM are normal. Pupils are equal, round, and reactive to light. Right eye exhibits no discharge.  Neck: Normal range of motion. Neck supple. No tracheal deviation present. No thyromegaly present.  Cardiovascular: Normal rate, regular rhythm, normal heart sounds and intact distal pulses.  No murmur heard. Pulmonary/Chest: Effort normal and breath sounds normal. No respiratory distress. He has no wheezes. He has no rales. He exhibits no tenderness.  Abdominal: Soft. He exhibits no distension and no mass. There is no tenderness. There is no rebound and no guarding.  Musculoskeletal: Normal range of motion. He exhibits no edema or tenderness.  Lymphadenopathy:    He has no cervical adenopathy.  Neurological: He is alert and  oriented to person, place, and time. He has normal reflexes. No cranial nerve deficit. He exhibits normal muscle tone. Coordination normal.  Skin: Skin is warm and dry. No rash noted. No erythema.  Psychiatric: He has a normal mood and affect. His behavior is normal. Judgment and thought content normal.        Assessment & Plan:     1. Establishing care with new doctor, encounter for Establishing from the [facility] since Greig RightSusan Fisher, PA-C has left.   2. Encounter for weight loss counseling Patient has been doing well with diet and exercise on his own since Christmas. He has went from 261 pounds on  12/31/16 to 247 pounds today. He is doing a low carb, low sugar dieting. He is not doing much exercise at this time. We will start phentermine as below. I will see him back in 1 month and will see how he is doing at that time.  - phentermine (ADIPEX-P) 37.5 MG tablet; Take 1 tablet (37.5 mg total) by mouth daily before breakfast.  Dispense: 30 tablet; Refill: 0  3. Class 2 obesity due to excess calories without serious comorbidity with body mass index (BMI) of 38.0 to 38.9 in adult See above medical treatment plan. - phentermine (ADIPEX-P) 37.5 MG tablet; Take 1 tablet (37.5 mg total) by mouth daily before breakfast.  Dispense: 30 tablet; Refill: 0       Margaretann LovelessJennifer M Burnette, PA-C  [facility]

## 2017-04-11 ENCOUNTER — Ambulatory Visit: Payer: Managed Care, Other (non HMO) | Admitting: Physician Assistant

## 2017-04-11 ENCOUNTER — Encounter: Payer: Self-pay | Admitting: Physician Assistant

## 2017-04-11 VITALS — BP 120/80 | HR 84 | Temp 98.4°F | Resp 16 | Ht 67.0 in | Wt 233.4 lb

## 2017-04-11 DIAGNOSIS — Z713 Dietary counseling and surveillance: Secondary | ICD-10-CM | POA: Diagnosis not present

## 2017-04-11 DIAGNOSIS — Z6836 Body mass index (BMI) 36.0-36.9, adult: Secondary | ICD-10-CM | POA: Diagnosis not present

## 2017-04-11 DIAGNOSIS — E6609 Other obesity due to excess calories: Secondary | ICD-10-CM

## 2017-04-11 MED ORDER — PHENTERMINE HCL 37.5 MG PO TABS: 37.5000 mg | ORAL_TABLET | Freq: Every day | ORAL | 2 refills | Status: DC

## 2017-04-11 NOTE — Progress Notes (Signed)
Patient: Terry Hayes Male    DOB: 03/17/1980   [age] y.o.   MRN: [id number] Visit Date: 04/11/2017  Today's Provider: Margaretann LovelessJennifer M Burnette, PA-C   Chief Complaint  Patient presents with  . Follow-up    Weight Loss Counseling.   Subjective:    HPI  Weight Loss Counseling, Follow up:  The patient was last seen for weight 4 weeks ago. Changes made since that visit include start Phentermine.  He reports excellent compliance with treatment. He is not having side effects.  He reports exercising. Reports he started 2 weeks ago with walking. He reports that he is eating reports that he was doing low carbs,high protein,no sugars.Reports that he added mor carbs.  Wt Readings from Last 3 Encounters:  04/11/17 233 lb 6.4 oz (105.9 kg)  03/11/17 247 lb 12.8 oz (112.4 kg)  10/02/16 247 lb (112 kg)   ------------------------------------------------------------------------      Allergies  Allergen Reactions  . Erythromycin Other (See Comments)    Severe stomach pains as a child     Current Outpatient Medications:  .  citalopram (CELEXA) 20 MG tablet, TAKE 1 TABLET (20 MG TOTAL) BY MOUTH DAILY., Disp: 30 tablet, Rfl: 5 .  omeprazole (PRILOSEC) 20 MG capsule, Take 1 capsule (20 mg total) by mouth daily., Disp: 30 capsule, Rfl: 3 .  phentermine (ADIPEX-P) 37.5 MG tablet, Take 1 tablet (37.5 mg total) by mouth daily before breakfast., Disp: 30 tablet, Rfl: 0  Review of Systems  Cardiovascular: Negative for chest pain, palpitations and leg swelling.    Social History   Tobacco Use  . Smoking status: Former Smoker    Last attempt to quit: 07/15/2001    Years since quitting: 15.7  . Smokeless tobacco: Never Used  Substance Use Topics  . Alcohol use: No   Objective:   BP 120/80 (BP Location: Left Arm, Patient Position: Sitting, Cuff Size: Normal)   Pulse 84   Temp 98.4 F (36.9 C) (Oral)   Resp 16   Ht 5\' 7"  (1.702 m)   Wt 233 lb 6.4 oz (105.9 kg)   BMI 36.56 kg/m     Physical Exam  Constitutional: He appears well-developed and well-nourished. No distress.  HENT:  Head: Normocephalic and atraumatic.  Neck: Normal range of motion. Neck supple. No tracheal deviation present. No thyromegaly present.  Cardiovascular: Normal rate, regular rhythm and normal heart sounds. Exam reveals no gallop and no friction rub.  No murmur heard. Pulmonary/Chest: Effort normal and breath sounds normal. No respiratory distress. He has no wheezes. He has no rales.  Musculoskeletal: He exhibits no edema.  Lymphadenopathy:    He has no cervical adenopathy.  Skin: He is not diaphoretic.  Vitals reviewed.      Assessment & Plan:      1. Class 2 obesity due to excess calories without serious comorbidity with body mass index (BMI) of 36.0 to 36.9 in adult Patient continues to do well. Lost 15 pounds since starting. Will continue phentermine as below for 3 months and I will see him back in 3 months for weight recheck.  - phentermine (ADIPEX-P) 37.5 MG tablet; Take 1 tablet (37.5 mg total) by mouth daily before breakfast.  Dispense: 30 tablet; Refill: 2  2. Encounter for weight loss counseling See above medical treatment plan. - phentermine (ADIPEX-P) 37.5 MG tablet; Take 1 tablet (37.5 mg total) by mouth daily before breakfast.  Dispense: 30 tablet; Refill: 2  Mar Daring, PA-C  [facility]

## 2017-06-05 ENCOUNTER — Ambulatory Visit: Payer: Self-pay

## 2017-06-06 ENCOUNTER — Ambulatory Visit: Payer: Self-pay | Admitting: Family Medicine

## 2017-06-06 VITALS — BP 126/79 | HR 79 | Resp 16 | Ht 67.0 in | Wt 230.0 lb

## 2017-06-06 DIAGNOSIS — Z0189 Encounter for other specified special examinations: Principal | ICD-10-CM

## 2017-06-06 DIAGNOSIS — Z008 Encounter for other general examination: Secondary | ICD-10-CM

## 2017-06-06 LAB — GLUCOSE, POCT (MANUAL RESULT ENTRY): POC GLUCOSE: 116 mg/dL — AB (ref 70–99)

## 2017-06-06 NOTE — Progress Notes (Signed)
Subjective: Annual biometrics screening  Patient presents for his annual biometric screening. Patient  denies any medical history.  Patient sees his primary care provider monthly since he is taking phentermine for weight loss.  Patient reports that he has lost 30 pounds since January.  Patient reports eating a healthy, well-balanced diet and getting regular exercise. PCP: Eliezer Bottom, PA. Patient works for the [employer]. Patient denies any other issues or concerns.   Review of Systems Unremarkable  Objective  Physical Exam General: Awake, alert and oriented. No acute distress. Well developed, hydrated and nourished. Appears stated age.  HEENT: Supple neck without adenopathy. Sclera is non-icteric. The ear canal is clear without discharge. The tympanic membrane is normal in appearance with normal landmarks and cone of light. Nasal mucosa is pink and moist. Oral mucosa is pink and moist. The pharynx is normal in appearance without tonsillar swelling or exudates.  Skin: Skin in warm, dry and intact without rashes or lesions. Appropriate color for ethnicity. Cardiac: Heart rate and rhythm are normal. No murmurs, gallops, or rubs are auscultated.  Respiratory: The chest wall is symmetric and without deformity. No signs of respiratory distress. Lung sounds are clear in all lobes bilaterally without rales, ronchi, or wheezes.  Neurological: The patient is awake, alert and oriented to person, place, and time with normal speech.  Memory is normal and thought processes intact. No gait abnormalities are appreciated.  Psychiatric: Appropriate mood and affect.   Assessment Annual biometrics screening  Plan  Lipid panel pending. Encouraged routine visits with primary care provider.  Fasting blood sugar is 116 today.  Patient denies any knowledge of being prediabetic but his records indicate a previous history of this.  Advised patient to follow-up with his primary care provider regarding  this. Encourage patient to eat a healthy, well-rounded diet and get regular exercise.

## 2017-06-07 LAB — LIPID PANEL
CHOLESTEROL TOTAL: 199 mg/dL (ref 100–199)
Chol/HDL Ratio: 5 ratio (ref 0.0–5.0)
HDL: 40 mg/dL (ref >39)
LDL CALC: 120 mg/dL — AB (ref 0–99)
Triglycerides: 196 mg/dL — ABNORMAL HIGH (ref 0–149)
VLDL Cholesterol Cal: 39 mg/dL (ref 5–40)

## 2017-06-09 NOTE — Progress Notes (Signed)
Carollee Herter, Will you call the patient and inform them that their lipid panel came back?  His total cholesterol, HDL cholesterol ("good cholesterol"), VLDL cholesterol, and cholesterol/HDL ratio are normal.  The triglyceride level is elevated at 196, normal values are between 0 and 149.    The LDL cholesterol ("bad cholesterol") is elevated at 120, normal values are below 99. Please advise the patient to follow-up with his primary care provider regarding these results.

## 2017-08-25 ENCOUNTER — Emergency Department: Payer: No Typology Code available for payment source

## 2017-08-25 ENCOUNTER — Encounter: Payer: Self-pay | Admitting: Physician Assistant

## 2017-08-25 ENCOUNTER — Encounter: Payer: Self-pay | Admitting: *Deleted

## 2017-08-25 ENCOUNTER — Emergency Department
Admission: EM | Admit: 2017-08-25 | Discharge: 2017-08-25 | Disposition: A | Discharge status: Home / Self Care | Payer: No Typology Code available for payment source | Attending: Student in an Organized Health Care Education/Training Program | Admitting: Student in an Organized Health Care Education/Training Program

## 2017-08-25 ENCOUNTER — Other Ambulatory Visit: Payer: Self-pay

## 2017-08-25 ENCOUNTER — Ambulatory Visit: Payer: Managed Care, Other (non HMO) | Admitting: Physician Assistant

## 2017-08-25 VITALS — BP 120/80 | HR 121 | Temp 99.1°F | Resp 18 | Wt 257.2 lb

## 2017-08-25 DIAGNOSIS — T5801XA Toxic effect of carbon monoxide from motor vehicle exhaust, accidental (unintentional), initial encounter: Secondary | ICD-10-CM | POA: Diagnosis not present

## 2017-08-25 DIAGNOSIS — Z87891 Personal history of nicotine dependence: Secondary | ICD-10-CM | POA: Insufficient documentation

## 2017-08-25 DIAGNOSIS — R110 Nausea: Secondary | ICD-10-CM

## 2017-08-25 DIAGNOSIS — R5383 Other fatigue: Secondary | ICD-10-CM | POA: Insufficient documentation

## 2017-08-25 DIAGNOSIS — Z7729 Contact with and (suspected ) exposure to other hazardous substances: Secondary | ICD-10-CM

## 2017-08-25 DIAGNOSIS — R000 Tachycardia, unspecified: Secondary | ICD-10-CM

## 2017-08-25 DIAGNOSIS — R202 Paresthesia of skin: Secondary | ICD-10-CM

## 2017-08-25 DIAGNOSIS — R0602 Shortness of breath: Secondary | ICD-10-CM

## 2017-08-25 DIAGNOSIS — R42 Dizziness and giddiness: Secondary | ICD-10-CM

## 2017-08-25 DIAGNOSIS — R531 Weakness: Secondary | ICD-10-CM | POA: Diagnosis present

## 2017-08-25 LAB — HEPATIC FUNCTION PANEL
ALK PHOS: 51 U/L (ref 38–126)
ALT: 27 U/L (ref 0–44)
AST: 26 U/L (ref 15–41)
Albumin: 4.6 g/dL (ref 3.5–5.0)
BILIRUBIN TOTAL: 1.7 mg/dL — AB (ref 0.3–1.2)
Bilirubin, Direct: 0.1 mg/dL (ref 0.0–0.2)
Indirect Bilirubin: 1.6 mg/dL — ABNORMAL HIGH (ref 0.3–0.9)
Total Protein: 8.5 g/dL — ABNORMAL HIGH (ref 6.5–8.1)

## 2017-08-25 LAB — BASIC METABOLIC PANEL
ANION GAP: 11 (ref 5–15)
BUN: 20 mg/dL (ref 6–20)
CALCIUM: 9.4 mg/dL (ref 8.9–10.3)
CO2: 25 mmol/L (ref 22–32)
Chloride: 100 mmol/L (ref 98–111)
Creatinine, Ser: 1.14 mg/dL (ref 0.61–1.24)
GFR calc non Af Amer: >60 mL/min (ref >60)
GLUCOSE: 107 mg/dL — AB (ref 70–99)
POTASSIUM: 4 mmol/L (ref 3.5–5.1)
Sodium: 136 mmol/L (ref 135–145)

## 2017-08-25 LAB — URINALYSIS, COMPLETE (UACMP) WITH MICROSCOPIC
BACTERIA UA: NONE SEEN
BILIRUBIN URINE: NEGATIVE
Glucose, UA: NEGATIVE mg/dL
Ketones, ur: NEGATIVE mg/dL
Leukocytes, UA: NEGATIVE
NITRITE: NEGATIVE
PROTEIN: NEGATIVE mg/dL
Specific Gravity, Urine: 1.009 (ref 1.005–1.030)
Squamous Epithelial / LPF: NONE SEEN (ref 0–5)
pH: 5 (ref 5.0–8.0)

## 2017-08-25 LAB — CBC
HEMATOCRIT: 43.9 % (ref 40.0–52.0)
Hemoglobin: 15 g/dL (ref 13.0–18.0)
MCH: 29.9 pg (ref 26.0–34.0)
MCHC: 34.2 g/dL (ref 32.0–36.0)
MCV: 87.4 fL (ref 80.0–100.0)
Platelets: 194 10*3/uL (ref 150–440)
RBC: 5.02 MIL/uL (ref 4.40–5.90)
RDW: 14.1 % (ref 11.5–14.5)
WBC: 8.5 10*3/uL (ref 3.8–10.6)

## 2017-08-25 LAB — CK: CK TOTAL: 153 U/L (ref 49–397)

## 2017-08-25 LAB — FIBRIN DERIVATIVES D-DIMER (ARMC ONLY): Fibrin derivatives D-dimer (ARMC): 186.37 ng/mL (FEU) (ref 0.00–499.00)

## 2017-08-25 LAB — CARBOXYHEMOGLOBIN - COOX: CARBOXYHEMOGLOBIN: 2.4 % — AB (ref 0.5–1.5)

## 2017-08-25 LAB — TROPONIN I: Troponin I: <0.03 ng/mL (ref <0.03)

## 2017-08-25 MED ORDER — DOXYCYCLINE HYCLATE 100 MG PO CAPS: 100.0000 mg | ORAL_CAPSULE | Freq: Two times a day (BID) | ORAL | 0 refills | Status: DC

## 2017-08-25 MED ORDER — SODIUM CHLORIDE 0.9 % IV BOLUS
1000.0000 mL | Freq: Once | INTRAVENOUS | Status: AC
  Administered 2017-08-25: 1000 mL via INTRAVENOUS

## 2017-08-25 MED ORDER — OXYCODONE-ACETAMINOPHEN 5-325 MG PO TABS
ORAL_TABLET | ORAL | Status: AC
  Filled 2017-08-25: qty 1

## 2017-08-25 MED ORDER — IBUPROFEN 800 MG PO TABS
ORAL_TABLET | ORAL | Status: AC
  Administered 2017-08-25: 800 mg via ORAL
  Filled 2017-08-25: qty 1

## 2017-08-25 MED ORDER — IBUPROFEN 800 MG PO TABS
800.0000 mg | ORAL_TABLET | Freq: Once | ORAL | Status: AC
  Administered 2017-08-25: 800 mg via ORAL

## 2017-08-25 NOTE — Discharge Instructions (Addendum)
Follow-up with your regular doctor return to the emergency department if not better in 3 to 5 days.  If you are worsening return to the emergency department.  Please discuss the carbon monoxide poisoning with your employer.  Due to the elevated carboxyhemoglobin it appears that your symptoms are from the exhaust that is cracked and entering into the cab of your car.
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
## 2017-08-25 NOTE — ED Notes (Signed)
Pt placed on a nonrebreather.  Pt alert
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
## 2017-08-25 NOTE — ED Notes (Signed)
Non-rebreather placed on pt at this time.
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
## 2017-08-25 NOTE — ED Notes (Signed)
Pt states no WC.  

## 2017-08-29 ENCOUNTER — Telehealth: Payer: Self-pay | Admitting: Physician Assistant

## 2017-08-29 NOTE — Telephone Encounter (Signed)
Letter printed.

## 2017-08-29 NOTE — Telephone Encounter (Signed)
Patient advised as directed below.  Thanks,  -Joseline 

## 2017-08-29 NOTE — Telephone Encounter (Signed)
Pt stated he had OV with Jenni on 08/25/17 due to carbon monoxide exposure and was sent to ED. Pt stated that he has been out of work since 08/25/17 and plans to return to work Saturday 08/30/17 but was just advised by his employer he must have a doctor's note to return to work tomorrow. Pt is request a return to work note stating he can return to full duty on Saturday 08/30/17. Pt would like to pick it letter today if possible. Please advise. Thanks TNP

## 2017-09-04 ENCOUNTER — Ambulatory Visit: Payer: Managed Care, Other (non HMO) | Admitting: Physician Assistant

## 2017-09-04 ENCOUNTER — Encounter: Payer: Self-pay | Admitting: Physician Assistant

## 2017-09-04 VITALS — BP 128/80 | HR 86 | Temp 98.3°F | Resp 16 | Wt 236.0 lb

## 2017-09-04 DIAGNOSIS — Z6836 Body mass index (BMI) 36.0-36.9, adult: Secondary | ICD-10-CM

## 2017-09-04 DIAGNOSIS — E6609 Other obesity due to excess calories: Secondary | ICD-10-CM

## 2017-09-04 DIAGNOSIS — T5801XD Toxic effect of carbon monoxide from motor vehicle exhaust, accidental (unintentional), subsequent encounter: Secondary | ICD-10-CM

## 2017-09-04 DIAGNOSIS — Z713 Dietary counseling and surveillance: Secondary | ICD-10-CM

## 2017-09-04 DIAGNOSIS — F419 Anxiety disorder, unspecified: Secondary | ICD-10-CM

## 2017-09-04 MED ORDER — PHENTERMINE HCL 37.5 MG PO TABS: 37.5000 mg | ORAL_TABLET | Freq: Every day | ORAL | 2 refills | Status: DC

## 2017-09-04 MED ORDER — CITALOPRAM HYDROBROMIDE 20 MG PO TABS: 20.0000 mg | ORAL_TABLET | Freq: Every day | ORAL | 1 refills | Status: DC
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
## 2017-11-09 ENCOUNTER — Emergency Department
Admission: EM | Admit: 2017-11-09 | Discharge: 2017-11-09 | Disposition: A | Discharge status: Home / Self Care | Payer: No Typology Code available for payment source | Attending: Emergency Medicine | Admitting: Emergency Medicine

## 2017-11-09 ENCOUNTER — Emergency Department: Payer: No Typology Code available for payment source

## 2017-11-09 DIAGNOSIS — M25512 Pain in left shoulder: Secondary | ICD-10-CM | POA: Diagnosis not present

## 2017-11-09 DIAGNOSIS — Y92410 Unspecified street and highway as the place of occurrence of the external cause: Secondary | ICD-10-CM | POA: Diagnosis not present

## 2017-11-09 DIAGNOSIS — Z87891 Personal history of nicotine dependence: Secondary | ICD-10-CM | POA: Diagnosis not present

## 2017-11-09 DIAGNOSIS — M79642 Pain in left hand: Secondary | ICD-10-CM | POA: Insufficient documentation

## 2017-11-09 DIAGNOSIS — Y999 Unspecified external cause status: Secondary | ICD-10-CM | POA: Diagnosis not present

## 2017-11-09 DIAGNOSIS — M25562 Pain in left knee: Secondary | ICD-10-CM

## 2017-11-09 DIAGNOSIS — Y9389 Activity, other specified: Secondary | ICD-10-CM | POA: Insufficient documentation

## 2017-11-09 DIAGNOSIS — Z79899 Other long term (current) drug therapy: Secondary | ICD-10-CM | POA: Insufficient documentation
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
## 2018-02-10 ENCOUNTER — Other Ambulatory Visit: Payer: Self-pay | Admitting: Physician Assistant

## 2018-02-10 DIAGNOSIS — Z713 Dietary counseling and surveillance: Secondary | ICD-10-CM

## 2018-02-10 DIAGNOSIS — E6609 Other obesity due to excess calories: Secondary | ICD-10-CM

## 2018-02-10 DIAGNOSIS — Z6836 Body mass index (BMI) 36.0-36.9, adult: Principal | ICD-10-CM

## 2018-02-10 NOTE — Telephone Encounter (Signed)
Pt needing a refill on:  phentermine (ADIPEX-P) 37.5 MG tablet  Please fill at:  [id number] [place], [place] - [address] [phone] (Phone) [phone] (Fax)   Thanks, [facility]

## 2018-02-18 NOTE — Progress Notes (Signed)
Patient: Terry Hayes Male    DOB: 03-07-1980   38 y.o.   MRN: [id number] Visit Date: 02/20/2018  Today's Provider: Margaretann Loveless, PA-C   Chief Complaint  Patient presents with  . Weight Loss  . URI   Subjective:    URI   This is a new problem. The current episode started yesterday. The problem has been unchanged. There has been no fever. Associated symptoms include congestion and coughing. Pertinent negatives include no abdominal pain, chest pain, ear pain, headaches, nausea, rhinorrhea, sinus pain, sore throat, vomiting or wheezing.    Weight Loss Patient presents today for weight loss follow-up. Patient has been on Phentermine, but patient has not been taking the medication since October, 2019. Reports fell off healthy habits during the holidays and started gaining weight again. He reports lowest weight was 209 pounds.       Allergies  Allergen Reactions  . Erythromycin Other (See Comments)    Severe stomach pains as a child     Current Outpatient Medications:  .  citalopram (CELEXA) 20 MG tablet, Take 1 tablet (20 mg total) by mouth daily., Disp: 90 tablet, Rfl: 1 .  phentermine (ADIPEX-P) 37.5 MG tablet, Take 1 tablet (37.5 mg total) by mouth daily before breakfast., Disp: 30 tablet, Rfl: 2  Review of Systems  Constitutional: Negative for appetite change, chills and fever.  HENT: Positive for congestion, postnasal drip and sinus pressure. Negative for ear pain, rhinorrhea, sinus pain and sore throat.   Respiratory: Positive for cough. Negative for chest tightness, shortness of breath and wheezing.   Cardiovascular: Negative for chest pain and palpitations.  Gastrointestinal: Negative for abdominal pain, nausea and vomiting.  Neurological: Negative for dizziness and headaches.    Social History   Tobacco Use  . Smoking status: Former Smoker    Last attempt to quit: 07/15/2001    Years since quitting: 16.6  . Smokeless tobacco: Never Used    Substance Use Topics  . Alcohol use: No      Objective:   BP 133/85 (BP Location: Left Arm, Patient Position: Sitting, Cuff Size: Large)   Pulse 90   Temp 97.8 F (36.6 C) (Oral)   Wt 246 lb (111.6 kg)   SpO2 96%   BMI 39.71 kg/m  Vitals:   02/20/18 1547  BP: 133/85  Pulse: 90  Temp: 97.8 F (36.6 C)  TempSrc: Oral  SpO2: 96%  Weight: 246 lb (111.6 kg)     Physical Exam Vitals signs reviewed.  Constitutional:      General: He is not in acute distress.    Appearance: He is well-developed. He is obese. He is not diaphoretic.  HENT:     Head: Normocephalic and atraumatic.     Right Ear: Hearing, tympanic membrane, ear canal and external ear normal. No middle ear effusion. Tympanic membrane is not erythematous or bulging.     Left Ear: Hearing, tympanic membrane, ear canal and external ear normal.  No middle ear effusion. Tympanic membrane is not erythematous or bulging.     Nose: Mucosal edema and rhinorrhea present.     Right Sinus: No maxillary sinus tenderness or frontal sinus tenderness.     Left Sinus: No maxillary sinus tenderness or frontal sinus tenderness.     Mouth/Throat:     Pharynx: Uvula midline. No oropharyngeal exudate or posterior oropharyngeal erythema.  Eyes:     General:        Right eye: No  discharge.        Left eye: No discharge.     Conjunctiva/sclera: Conjunctivae normal.     Pupils: Pupils are equal, round, and reactive to light.  Neck:     Musculoskeletal: Normal range of motion and neck supple.     Thyroid: No thyromegaly.     Trachea: No tracheal deviation.     Meningeal: Brudzinski's sign and Kernig's sign absent.  Cardiovascular:     Rate and Rhythm: Normal rate and regular rhythm.     Heart sounds: Normal heart sounds. No murmur. No friction rub. No gallop.   Pulmonary:     Effort: Pulmonary effort is normal. No respiratory distress.     Breath sounds: Normal breath sounds. No stridor. No wheezing or rales.  Lymphadenopathy:      Cervical: No cervical adenopathy.  Skin:    General: Skin is warm and dry.         Assessment & Plan    1. Viral URI with cough OTC Mucinex for congestion. Bromfed Dm given as below for cough. Call if symptoms fail to improve or worsen over the next 10 days.  - brompheniramine-pseudoephedrine-DM 30-2-10 MG/5ML syrup; Take 5 mLs by mouth 4 (four) times daily as needed.  Dispense: 180 mL; Refill: 0  2. Class 2 obesity due to excess calories without serious comorbidity with body mass index (BMI) of 39.0 to 39.9 in adult Was successful in the past using phentermine. Will restart as below x 3 months. I will see him back in 4 weeks for weight recheck to hold him accountable. 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  - phentermine (ADIPEX-P) 37.5 MG tablet; Take 1 tablet (37.5 mg total) by mouth daily before breakfast.  Dispense: 30 tablet; Refill: 2  3. Encounter for weight loss counseling See above medical treatment plan. - phentermine (ADIPEX-P) 37.5 MG tablet; Take 1 tablet (37.5 mg total) by mouth daily before breakfast.  Dispense: 30 tablet; Refill: 2     Margaretann Loveless, PA-C  [facility]

## 2018-02-20 ENCOUNTER — Encounter: Payer: Self-pay | Admitting: Physician Assistant

## 2018-02-20 ENCOUNTER — Ambulatory Visit (INDEPENDENT_AMBULATORY_CARE_PROVIDER_SITE_OTHER): Payer: Managed Care, Other (non HMO) | Admitting: Physician Assistant

## 2018-02-20 VITALS — BP 133/85 | HR 90 | Temp 97.8°F | Wt 246.0 lb

## 2018-02-20 DIAGNOSIS — J069 Acute upper respiratory infection, unspecified: Secondary | ICD-10-CM

## 2018-02-20 DIAGNOSIS — B9789 Other viral agents as the cause of diseases classified elsewhere: Secondary | ICD-10-CM | POA: Diagnosis not present

## 2018-02-20 DIAGNOSIS — Z713 Dietary counseling and surveillance: Secondary | ICD-10-CM | POA: Diagnosis not present

## 2018-02-20 DIAGNOSIS — E6609 Other obesity due to excess calories: Secondary | ICD-10-CM

## 2018-02-20 DIAGNOSIS — Z6839 Body mass index (BMI) 39.0-39.9, adult: Secondary | ICD-10-CM

## 2018-02-20 MED ORDER — PSEUDOEPH-BROMPHEN-DM 30-2-10 MG/5ML PO SYRP: 5.0000 mL | ORAL_SOLUTION | Freq: Four times a day (QID) | ORAL | 0 refills | Status: DC | PRN

## 2018-02-20 MED ORDER — PHENTERMINE HCL 37.5 MG PO TABS: 37.5000 mg | ORAL_TABLET | Freq: Every day | ORAL | 2 refills | Status: DC

## 2018-02-25 ENCOUNTER — Other Ambulatory Visit: Payer: Self-pay | Admitting: Physician Assistant

## 2018-02-25 DIAGNOSIS — F419 Anxiety disorder, unspecified: Secondary | ICD-10-CM

## 2018-03-30 NOTE — Progress Notes (Deleted)
Patient: Terry Hayes, Male    DOB: [birth year], 38 y.o.   MRN: [id number] Visit Date: 03/30/2018  Today's Provider: Margaretann Loveless, PA-C   No chief complaint on file.  Subjective:     Annual physical exam Terry Hayes is a 38 y.o. male who presents today for health maintenance and complete physical. He feels {DESC; WELL/FAIRLY WELL/POORLY:18703}. He reports exercising ***. He reports he is sleeping {DESC; WELL/FAIRLY WELL/POORLY:18703}.  -----------------------------------------------------------------   Review of Systems  Constitutional: Negative.   HENT: Negative.   Eyes: Negative.   Respiratory: Negative.   Cardiovascular: Negative.   Gastrointestinal: Negative.   Endocrine: Negative.   Genitourinary: Negative.   Musculoskeletal: Negative.   Skin: Negative.   Allergic/Immunologic: Negative.   Neurological: Negative.   Hematological: Negative.   Psychiatric/Behavioral: Negative.     Social History      He  reports that he quit smoking about 16 years ago. He has never used smokeless tobacco. He reports that he does not drink alcohol or use drugs.       Social History   Socioeconomic History  . Marital status: Married    Spouse name: Not on file  . Number of children: Not on file  . Years of education: Not on file  . Highest education level: Not on file  Occupational History  . Not on file  Social Needs  . Financial resource strain: Not on file  . Food insecurity:    Worry: Not on file    Inability: Not on file  . Transportation needs:    Medical: Not on file    Non-medical: Not on file  Tobacco Use  . Smoking status: Former Smoker    Last attempt to quit: 07/15/2001    Years since quitting: 16.7  . Smokeless tobacco: Never Used  Substance and Sexual Activity  . Alcohol use: No  . Drug use: No  . Sexual activity: Yes  Lifestyle  . Physical activity:    Days per week: Not on file    Minutes per session: Not on file  . Stress: Not on  file  Relationships  . Social connections:    Talks on phone: Not on file    Gets together: Not on file    Attends religious service: Not on file    Active member of club or organization: Not on file    Attends meetings of clubs or organizations: Not on file    Relationship status: Not on file  Other Topics Concern  . Not on file  Social History Narrative  . Not on file    Past Medical History:  Diagnosis Date  . Pericarditis    secondary to flu     Patient Active Problem List   Diagnosis Date Noted  . Morbid obesity (HCC) 12/31/2016  . Pre-diabetes 11/26/2016  . Hypercholesterolemia 11/26/2016  . Morbid obesity with BMI of 40.0-44.9, adult (HCC) 11/13/2016  . Gastroesophageal reflux disease without esophagitis 11/13/2016  . Hyperprolactinemia (HCC) 06/03/2013  . Disease of pituitary gland (HCC) 06/03/2013    No past surgical history on file.  Family History        Family Status  Relation Name Status  . Mother  Alive  . Father  Alive  . Sister  Alive  . MGM  (Not Specified)  . PGM  (Not Specified)  . Neg Hx  (Not Specified)        His family history includes COPD in his maternal  grandmother; Cancer in his paternal grandmother; Cirrhosis in his mother; Colitis in his sister; Diabetes in his maternal grandmother; Ulcerative colitis in his sister. There is no history of Birth defects, Heart disease, or Stroke.      Allergies  Allergen Reactions  . Erythromycin Other (See Comments)    Severe stomach pains as a child     Current Outpatient Medications:  .  brompheniramine-pseudoephedrine-DM 30-2-10 MG/5ML syrup, Take 5 mLs by mouth 4 (four) times daily as needed., Disp: 180 mL, Rfl: 0 .  citalopram (CELEXA) 20 MG tablet, TAKE 1 TABLET BY MOUTH EVERY DAY, Disp: 90 tablet, Rfl: 1 .  phentermine (ADIPEX-P) 37.5 MG tablet, Take 1 tablet (37.5 mg total) by mouth daily before breakfast., Disp: 30 tablet, Rfl: 2   Patient Care Team: Reine Just as PCP  - General (Family Medicine)    Objective:    Vitals: There were no vitals taken for this visit.  There were no vitals filed for this visit.   Physical Exam Constitutional:      Appearance: Normal appearance.  HENT:     Right Ear: Tympanic membrane, ear canal and external ear normal.     Left Ear: Tympanic membrane, ear canal and external ear normal.     Nose: Nose normal.     Mouth/Throat:     Mouth: Mucous membranes are moist.     Pharynx: Oropharynx is clear.  Eyes:     Extraocular Movements: Extraocular movements intact.     Conjunctiva/sclera: Conjunctivae normal.     Pupils: Pupils are equal, round, and reactive to light.  Neck:     Musculoskeletal: Normal range of motion and neck supple.  Cardiovascular:     Rate and Rhythm: Normal rate and regular rhythm.     Pulses: Normal pulses.     Heart sounds: Normal heart sounds.  Pulmonary:     Effort: Pulmonary effort is normal.     Breath sounds: Normal breath sounds.  Abdominal:     General: Bowel sounds are normal.  Musculoskeletal: Normal range of motion.  Skin:    General: Skin is warm.  Neurological:     Mental Status: He is alert and oriented to person, place, and time.  Psychiatric:        Mood and Affect: Mood normal.        Behavior: Behavior normal.        Thought Content: Thought content normal.        Judgment: Judgment normal.      Depression Screen PHQ 2/9 Scores 03/11/2017  PHQ - 2 Score 0  PHQ- 9 Score 0       Assessment & Plan:     Routine Health Maintenance and Physical Exam  Exercise Activities and Dietary recommendations Goals   None      There is no immunization history on file for this patient.  Health Maintenance  Topic Date Due  . HIV Screening  11/27/1993  . TETANUS/TDAP  11/27/1997  . INFLUENZA VACCINE  09/04/2017     Discussed health benefits of physical activity, and encouraged him to engage in regular exercise appropriate for his age and condition.      --------------------------------------------------------------------    Margaretann Loveless, PA-C  [facility]

## 2018-04-01 ENCOUNTER — Encounter: Payer: Managed Care, Other (non HMO) | Admitting: Physician Assistant

## 2018-04-10 ENCOUNTER — Encounter: Payer: Managed Care, Other (non HMO) | Admitting: Physician Assistant

## 2018-04-20 ENCOUNTER — Ambulatory Visit (INDEPENDENT_AMBULATORY_CARE_PROVIDER_SITE_OTHER): Payer: Managed Care, Other (non HMO) | Admitting: Family Medicine

## 2018-04-20 ENCOUNTER — Encounter: Payer: Self-pay | Admitting: Family Medicine

## 2018-04-20 ENCOUNTER — Other Ambulatory Visit: Payer: Self-pay

## 2018-04-20 VITALS — BP 131/87 | HR 81 | Temp 98.2°F | Wt 234.4 lb

## 2018-04-20 DIAGNOSIS — R05 Cough: Secondary | ICD-10-CM

## 2018-04-20 DIAGNOSIS — R058 Other specified cough: Secondary | ICD-10-CM

## 2018-04-20 NOTE — Patient Instructions (Signed)
Cough, Adult  Coughing is a reflex that clears your throat and your airways. Coughing helps to heal and protect your lungs. It is normal to cough occasionally, but a cough that happens with other symptoms or lasts a long time may be a sign of a condition that needs treatment. A cough may last only 2-3 weeks (acute), or it may last longer than 8 weeks (chronic). What are the causes? Coughing is commonly caused by:  Breathing in substances that irritate your lungs.  A viral or bacterial respiratory infection.  Allergies.  Asthma.  Postnasal drip.  Smoking.  Acid backing up from the stomach into the esophagus (gastroesophageal reflux).  Certain medicines.  Chronic lung problems, including COPD (or rarely, lung cancer).  Other medical conditions such as heart failure. Follow these instructions at home: Pay attention to any changes in your symptoms. Take these actions to help with your discomfort:  Take medicines only as told by your health care provider. ? If you were prescribed an antibiotic medicine, take it as told by your health care provider. Do not stop taking the antibiotic even if you start to feel better. ? Talk with your health care provider before you take a cough suppressant medicine.  Drink enough fluid to keep your urine clear or pale yellow.  If the air is dry, use a cold steam vaporizer or humidifier in your bedroom or your home to help loosen secretions.  Avoid anything that causes you to cough at work or at home.  If your cough is worse at night, try sleeping in a semi-upright position.  Avoid cigarette smoke. If you smoke, quit smoking. If you need help quitting, ask your health care provider.  Avoid caffeine.  Avoid alcohol.  Rest as needed. Contact a health care provider if:  You have new symptoms.  You cough up pus.  Your cough does not get better after 2-3 weeks, or your cough gets worse.  You cannot control your cough with suppressant  medicines and you are losing sleep.  You develop pain that is getting worse or pain that is not controlled with pain medicines.  You have a fever.  You have unexplained weight loss.  You have night sweats. Get help right away if:  You cough up blood.  You have difficulty breathing.  Your heartbeat is very fast. This information is not intended to replace advice given to you by your health care provider. Make sure you discuss any questions you have with your health care provider. Document Released: 07/20/2010 Document Revised: 06/29/2015 Document Reviewed: 03/30/2014 Elsevier Interactive Patient Education  2019 Elsevier Inc.  

## 2018-04-20 NOTE — Progress Notes (Signed)
Patient: Terry Hayes Male    DOB: 1980/02/12   38 y.o.   MRN: [id number] Visit Date: 04/20/2018  Today's Provider: Shirlee Latch, MD   Chief Complaint  Patient presents with  . Cough   Subjective:    I, Presley Raddle, CMA, am acting as a Neurosurgeon for Shirlee Latch, MD.    Cough  This is a new problem. Episode onset: 2 weeks. The problem has been gradually worsening. The problem occurs every few minutes. The cough is non-productive. Associated symptoms include a sore throat (improved). The symptoms are aggravated by lying down. Treatments tried: Amoxicillin. The treatment provided moderate relief.   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 Erythromycin Other (See Comments)    Severe stomach pains as a child     Current Outpatient Medications:  .  citalopram (CELEXA) 20 MG tablet, TAKE 1 TABLET BY MOUTH EVERY DAY, Disp: 90 tablet, Rfl: 1 .  phentermine (ADIPEX-P) 37.5 MG tablet, Take 1 tablet (37.5 mg total) by mouth daily before breakfast., Disp: 30 tablet, Rfl: 2 .  brompheniramine-pseudoephedrine-DM 30-2-10 MG/5ML syrup, Take 5 mLs by mouth 4 (four) times daily as needed. (Patient not taking: Reported on 04/20/2018), Disp: 180 mL, Rfl: 0  Review of Systems  Constitutional: Negative.   HENT: Positive for sore throat (improved).   Respiratory: Positive for cough.   Cardiovascular: Negative.   Musculoskeletal: Negative.     Social History   Tobacco Use  . Smoking status: Former Smoker    Last attempt to quit: 07/15/2001    Years since quitting: 16.7  . Smokeless tobacco: Never Used  Substance Use Topics  . Alcohol use: No      Objective:   BP 131/87 (BP Location: Right Arm, Patient Position: Sitting, Cuff Size: Large)    Pulse 81   Temp 98.2 F (36.8 C) (Oral)   Wt 234 lb 6.4 oz (106.3 kg)   SpO2 97%   BMI 37.83 kg/m  Vitals:   04/20/18 0944  BP: 131/87  Pulse: 81  Temp: 98.2 F (36.8 C)  TempSrc: Oral  SpO2: 97%  Weight: 234 lb 6.4 oz (106.3 kg)     Physical Exam Vitals signs reviewed.  Constitutional:      General: He is not in acute distress.    Appearance: Normal appearance. He is not diaphoretic.  HENT:     Head: Normocephalic and atraumatic.     Right Ear: Tympanic membrane, ear canal and external ear normal.     Left Ear: Tympanic membrane, ear canal and external ear normal.     Nose: Nose normal. No congestion.     Mouth/Throat:     Mouth: Mucous membranes are moist.     Pharynx: Oropharynx is clear. Posterior oropharyngeal erythema present. No oropharyngeal exudate.  Eyes:     General: No scleral icterus.    Conjunctiva/sclera: Conjunctivae normal.     Pupils: Pupils are equal, round, and reactive to light.  Neck:     Musculoskeletal: Neck supple.  Cardiovascular:     Rate and Rhythm: Normal rate and regular rhythm.     Pulses: Normal pulses.     Heart sounds: Normal heart sounds. No murmur.  Pulmonary:  Effort: Pulmonary effort is normal. No respiratory distress.     Breath sounds: Normal breath sounds. No wheezing or rhonchi.  Musculoskeletal:     Right lower leg: No edema.     Left lower leg: No edema.  Lymphadenopathy:     Cervical: No cervical adenopathy.  Skin:    General: Skin is warm and dry.     Capillary Refill: Capillary refill takes less than 2 seconds.     Findings: No rash.  Neurological:     Mental Status: He is alert and oriented to person, place, and time. Mental status is at baseline.  Psychiatric:        Mood and Affect: Mood normal.        Behavior: Behavior normal.         Assessment & Plan   1. Post-viral cough syndrome - symptoms and exam c/w viral URI that is resolved and now with post-viral cough - no evidence of strep  pharyngitis, CAP, AOM, bacterial sinusitis, or other bacterial infection - discussed symptomatic management, natural course, and return precautions   Return if symptoms worsen or fail to improve.   The entirety of the information documented in the History of Present Illness, Review of Systems and Physical Exam were personally obtained by me. Portions of this information were initially documented by Presley Raddle, CMA and reviewed by me for thoroughness and accuracy.    Erasmo Downer, MD, MPH [facility] 04/20/2018 10:06 AM

## 2018-09-24 ENCOUNTER — Other Ambulatory Visit: Payer: Self-pay | Admitting: Physician Assistant

## 2018-09-24 DIAGNOSIS — Z713 Dietary counseling and surveillance: Secondary | ICD-10-CM

## 2018-09-24 DIAGNOSIS — E6609 Other obesity due to excess calories: Secondary | ICD-10-CM

## 2018-09-24 MED ORDER — PHENTERMINE HCL 37.5 MG PO TABS: 37.5000 mg | ORAL_TABLET | Freq: Every day | ORAL | 2 refills | Status: AC

## 2018-09-24 NOTE — Telephone Encounter (Signed)
°  Pt wanting to know if he can get a refill on:  Phentermine  Please fill at:  [id number] [address], [place] (Phone) [phone] (Fax)   Thanks, [facility]

## 2018-10-29 ENCOUNTER — Encounter: Payer: Self-pay | Admitting: Physician Assistant

## 2018-10-29 ENCOUNTER — Ambulatory Visit (INDEPENDENT_AMBULATORY_CARE_PROVIDER_SITE_OTHER): Payer: Managed Care, Other (non HMO) | Admitting: Physician Assistant

## 2018-10-29 ENCOUNTER — Other Ambulatory Visit: Payer: Self-pay

## 2018-10-29 VITALS — BP 126/77 | HR 89 | Temp 97.3°F | Resp 16 | Ht 67.0 in | Wt 228.0 lb

## 2018-10-29 DIAGNOSIS — N410 Acute prostatitis: Secondary | ICD-10-CM | POA: Diagnosis not present

## 2018-10-29 DIAGNOSIS — R369 Urethral discharge, unspecified: Secondary | ICD-10-CM | POA: Diagnosis not present

## 2018-10-29 LAB — POCT URINALYSIS DIPSTICK
Appearance: NORMAL
Bilirubin, UA: NEGATIVE
Glucose, UA: NEGATIVE
Ketones, UA: NEGATIVE
Leukocytes, UA: NEGATIVE
Nitrite, UA: NEGATIVE
Odor: NORMAL
Protein, UA: NEGATIVE
Spec Grav, UA: 1.01 (ref 1.010–1.025)
Urobilinogen, UA: 0.2 E.U./dL
pH, UA: 6 (ref 5.0–8.0)

## 2018-10-29 MED ORDER — DOXYCYCLINE HYCLATE 100 MG PO TABS: 100.0000 mg | ORAL_TABLET | Freq: Two times a day (BID) | ORAL | 0 refills | Status: AC

## 2018-10-29 NOTE — Patient Instructions (Signed)

## 2018-10-29 NOTE — Progress Notes (Signed)
Patient: Terry Hayes Male    DOB: 05/02/1980   38 y.o.   MRN: [id number] Visit Date: 10/29/2018  Today's Provider: Mar Daring, PA-C   Chief Complaint  Patient presents with  . Testicle Pain   Subjective:     Testicle Pain The patient's primary symptoms include pelvic pain, penile discharge and testicular pain. The patient's pertinent negatives include no genital injury, genital itching, genital lesions, penile pain, priapism or scrotal swelling. This is a new problem. The current episode started 1 to 4 weeks ago (about 2 weeks). Associated symptoms include flank pain. Pertinent negatives include no abdominal pain, anorexia, chest pain, chills, constipation, coughing, diarrhea, discolored urine, dysuria, fever, frequency, headaches, hematuria, hesitancy, joint pain, joint swelling, nausea, painful intercourse, rash, shortness of breath, sore throat, urgency, urinary retention or vomiting. The testicular pain affects the right testicle. The penile discharge was clear. He has tried nothing for the symptoms. The treatment provided no relief. He is sexually active.     Patient has had low back pain for 2 weeks. Patient states he began having right testicular pain about a day or two ago. Patient also states he noticed a clear discharge coming from his penis. Patient states pain is constant. Patient has taken otc ibuprofen with no relief. He is having rectal pain, difficulty sitting and urine frequency.   Allergies  Allergen Reactions  . Erythromycin Other (See Comments)    Severe stomach pains as a child     Current Outpatient Medications:  .  phentermine (ADIPEX-P) 37.5 MG tablet, Take 1 tablet (37.5 mg total) by mouth daily before breakfast., Disp: 30 tablet, Rfl: 2 .  brompheniramine-pseudoephedrine-DM 30-2-10 MG/5ML syrup, Take 5 mLs by mouth 4 (four) times daily as needed. (Patient not taking: Reported on 04/20/2018), Disp: 180 mL, Rfl: 0 .  citalopram (CELEXA) 20 MG  tablet, TAKE 1 TABLET BY MOUTH EVERY DAY (Patient not taking: Reported on 10/29/2018), Disp: 90 tablet, Rfl: 1  Review of Systems  Constitutional: Negative for appetite change, chills and fever.  HENT: Negative for sore throat.   Respiratory: Negative for cough, chest tightness, shortness of breath and wheezing.   Cardiovascular: Negative for chest pain and palpitations.  Gastrointestinal: Positive for rectal pain. Negative for abdominal pain, anorexia, constipation, diarrhea, nausea and vomiting.  Genitourinary: Positive for discharge, flank pain, pelvic pain and testicular pain. Negative for dysuria, frequency, hesitancy, penile pain, scrotal swelling and urgency.  Musculoskeletal: Positive for back pain. Negative for joint pain.  Skin: Negative for rash.  Neurological: Negative for headaches.    Social History   Tobacco Use  . Smoking status: Former Smoker    Quit date: 07/15/2001    Years since quitting: 17.3  . Smokeless tobacco: Never Used  Substance Use Topics  . Alcohol use: No      Objective:   BP 126/77 (BP Location: Right Arm, Patient Position: Sitting)   Pulse 89   Temp (!) 97.3 F (36.3 C) (Other (Comment))   Resp 16   Ht 5\' 7"  (1.702 m)   Wt 228 lb (103.4 kg)   SpO2 96%   BMI 35.71 kg/m  Vitals:   10/29/18 1411  BP: 126/77  Pulse: 89  Resp: 16  Temp: (!) 97.3 F (36.3 C)  TempSrc: Other (Comment)  SpO2: 96%  Weight: 228 lb (103.4 kg)  Height: 5\' 7"  (1.702 m)  Body mass index is 35.71 kg/m.   Physical Exam Vitals signs reviewed.  Constitutional:  General: He is not in acute distress.    Appearance: Normal appearance. He is well-developed. He is obese. He is not ill-appearing or diaphoretic.  HENT:     Head: Normocephalic and atraumatic.  Neck:     Musculoskeletal: Normal range of motion and neck supple.     Thyroid: No thyromegaly.     Vascular: No JVD.     Trachea: No tracheal deviation.  Cardiovascular:     Rate and Rhythm: Normal rate  and regular rhythm.     Heart sounds: Normal heart sounds. No murmur. No friction rub. No gallop.   Pulmonary:     Effort: Pulmonary effort is normal. No respiratory distress.     Breath sounds: Normal breath sounds. No wheezing or rales.  Abdominal:     General: Abdomen is flat.     Palpations: Abdomen is soft.     Tenderness: There is no abdominal tenderness.  Lymphadenopathy:     Cervical: No cervical adenopathy.  Neurological:     Mental Status: He is alert.      No results found for any visits on 10/29/18.     Assessment & Plan    1. Penile discharge UA normal. Will STD test to r/o other causes. Suspect prostatitis. Will treat with doxycycline as below. Call if not improving.  - POCT Urinalysis Dipstick - GC/Chlamydia Probe Amp([facility])  2. Acute prostatitis See above medical treatment plan. - doxycycline (VIBRA-TABS) 100 MG tablet; Take 1 tablet (100 mg total) by mouth 2 (two) times daily.  Dispense: 20 tablet; Refill: 0     Margaretann Loveless, PA-C  [facility]

## 2018-11-02 LAB — GC/CHLAMYDIA PROBE AMP
Chlamydia trachomatis, NAA: NEGATIVE
Neisseria Gonorrhoeae by PCR: NEGATIVE

## 2019-03-30 ENCOUNTER — Encounter: Payer: Self-pay | Admitting: Emergency Medicine

## 2019-03-30 ENCOUNTER — Emergency Department
Admission: EM | Admit: 2019-03-30 | Discharge: 2019-03-30 | Disposition: A | Discharge status: Home / Self Care | Payer: No Typology Code available for payment source | Attending: Emergency Medicine | Admitting: Emergency Medicine

## 2019-03-30 ENCOUNTER — Emergency Department: Payer: No Typology Code available for payment source

## 2019-03-30 ENCOUNTER — Other Ambulatory Visit: Payer: Self-pay

## 2019-03-30 DIAGNOSIS — Z87891 Personal history of nicotine dependence: Secondary | ICD-10-CM | POA: Insufficient documentation

## 2019-03-30 DIAGNOSIS — S43401A Unspecified sprain of right shoulder joint, initial encounter: Secondary | ICD-10-CM | POA: Diagnosis not present

## 2019-03-30 DIAGNOSIS — Y990 Civilian activity done for income or pay: Secondary | ICD-10-CM | POA: Diagnosis not present

## 2019-03-30 DIAGNOSIS — M25511 Pain in right shoulder: Secondary | ICD-10-CM

## 2019-03-30 DIAGNOSIS — Y9372 Activity, wrestling: Secondary | ICD-10-CM | POA: Insufficient documentation

## 2019-03-30 DIAGNOSIS — Z79899 Other long term (current) drug therapy: Secondary | ICD-10-CM | POA: Insufficient documentation

## 2019-03-30 DIAGNOSIS — Y929 Unspecified place or not applicable: Secondary | ICD-10-CM | POA: Diagnosis not present

## 2019-03-30 DIAGNOSIS — S6991XA Unspecified injury of right wrist, hand and finger(s), initial encounter: Secondary | ICD-10-CM | POA: Insufficient documentation

## 2019-03-30 DIAGNOSIS — S4991XA Unspecified injury of right shoulder and upper arm, initial encounter: Secondary | ICD-10-CM | POA: Diagnosis present

## 2019-03-30 NOTE — ED Triage Notes (Signed)
Patient ambulatory to triage with steady gait, without difficulty or distress noted; pt employed with [employer] [place] [employer]; st injured rt shoulder and rt 4th finger during an arrest PTA

## 2019-03-30 NOTE — Discharge Instructions (Addendum)
You may take Tylenol and/or Ibuprofen as needed.  Return to the ER for worsening symptoms, persistent vomiting, difficulty breathing or other concerns.

## 2019-03-30 NOTE — ED Provider Notes (Signed)
[facility] Emergency Department Provider Note   ____________________________________________   First MD Initiated Contact with Patient 03/30/19 0407     (approximate)  I have reviewed the triage vital signs and the nursing notes.   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  Presents with right shoulder and right fourth digit pain.  Patient is right-hand dominant.  Denies striking head or LOC.  Voices no other complaints or injuries.       Past Medical History:  Diagnosis Date  . Pericarditis    secondary to flu    Patient Active Problem List   Diagnosis Date Noted  . Morbid obesity ([place]) 12/31/2016  . Pre-diabetes 11/26/2016  . Hypercholesterolemia 11/26/2016  . Morbid obesity with BMI of 40.0-44.9, adult ([place]) 11/13/2016  . Gastroesophageal reflux disease without esophagitis 11/13/2016  . Hyperprolactinemia ([place]) 06/03/2013  . 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 No pertinent surgical history.  Prior to Admission medications   Medication Sig Start Date End Date Taking? Authorizing Provider  doxycycline (VIBRA-TABS) 100 MG tablet Take 1 tablet (100 mg total) by mouth 2 (two) times daily. 10/29/18   Mar Daring, PA-C  phentermine (ADIPEX-P) 37.5 MG tablet Take 1 tablet (37.5 mg total) by mouth daily before breakfast. 09/24/18   Mar Daring, PA-C    Allergies Erythromycin  Family History  Problem Relation Age of Onset  . Cirrhosis Mother        no etoh  . Ulcerative colitis Sister   . Colitis Sister   . Diabetes Maternal Grandmother   . COPD Maternal Grandmother   . Cancer Paternal Grandmother        liver  . Birth defects Neg Hx   . Heart disease Neg Hx   . Stroke Neg Hx     Social History Social History   Tobacco Use  . Smoking status: Former Smoker    Quit date: 07/15/2001     Years since quitting: 17.7  . Smokeless tobacco: Never Used  Substance Use Topics  . Alcohol use: No  . Drug use: No    Review of Systems  Constitutional: No fever/chills Eyes: No visual changes. ENT: No sore throat. Cardiovascular: Denies chest pain. Respiratory: Denies shortness of breath. Gastrointestinal: No abdominal pain.  No nausea, no vomiting.  No diarrhea.  No constipation. Genitourinary: Negative for dysuria. Musculoskeletal: Positive for right shoulder and right fourth digit pain.  Negative for back pain. Skin: Negative for rash. Neurological: Negative for headaches, focal weakness or numbness.   ____________________________________________   PHYSICAL EXAM:  VITAL SIGNS: ED Triage Vitals  Enc Vitals Group     BP 03/30/19 0237 133/90     Pulse Rate 03/30/19 0237 (!) 112     Resp 03/30/19 0237 19     Temp 03/30/19 0237 99 F (37.2 C)     Temp Source 03/30/19 0237 Oral     SpO2 03/30/19 0237 96 %     Weight 03/30/19 0237 210 lb (95.3 kg)     Height 03/30/19 0237 5\' 7"  (1.702 m)     Head Circumference --      Peak Flow --      Pain Score 03/30/19 0243 5     Pain Loc --      Pain Edu? --      Excl. in [place]? --  Constitutional: Alert and oriented. Well appearing and in no acute distress. Eyes: Conjunctivae are normal. PERRL. EOMI. Head: Atraumatic. Nose: Atraumatic. Mouth/Throat: Mucous membranes are moist.  No dental malocclusion. Neck: No stridor.  No cervical spine tenderness to palpation. Cardiovascular: Normal rate, regular rhythm. Grossly normal heart sounds.  Good peripheral circulation. Respiratory: Normal respiratory effort.  No retractions. Lungs CTAB. Gastrointestinal: Soft and nontender. No distention. No abdominal bruits. No CVA tenderness. Musculoskeletal:  Right shoulder: Tender to palpation anteriorly.  No deformity noted.  Full range of motion with mild pain. Right fourth digit: Mildly tender to palpation distal IP joint.  Limited  range of motion secondary to pain. No lower extremity tenderness nor edema.  No joint effusions. Neurologic:  Normal speech and language. No gross focal neurologic deficits are appreciated. No gait instability. Skin:  Skin is warm, dry and intact. No rash noted. Psychiatric: Mood and affect are normal. Speech and behavior are normal.  ____________________________________________   LABS (all labs ordered are listed, but only abnormal results are displayed)  Labs Reviewed - No data to display ____________________________________________  EKG  None ____________________________________________  RADIOLOGY  ED MD interpretation: No acute traumatic osseous injuries to the right shoulder or ring finger  Official radiology report(s): DG Shoulder Right  Result Date: 03/30/2019 CLINICAL DATA:  Right shoulder injury EXAM: RIGHT SHOULDER - 2+ VIEW COMPARISON:  None. FINDINGS: Frontal, transscapular, and axillary views of the right shoulder are obtained. Alignment is anatomic. No fractures. Joint spaces are well preserved. Right chest is clear. IMPRESSION: 1. Unremarkable right shoulder. Electronically Signed   By: Sharlet Salina M.D.   On: 03/30/2019 03:34   DG Finger Ring Right  Result Date: 03/30/2019 CLINICAL DATA:  Injury EXAM: RIGHT RING FINGER 2+V COMPARISON:  None FINDINGS: Frontal, oblique, and lateral views of the right fourth digit demonstrate no fractures. Alignment is anatomic. Joint spaces are well preserved. Soft tissues are normal. IMPRESSION: 1. Unremarkable right fourth digit. Electronically Signed   By: Sharlet Salina M.D.   On: 03/30/2019 03:34    ____________________________________________   PROCEDURES  Procedure(s) performed (including Critical Care):  Procedures   ____________________________________________   INITIAL IMPRESSION / ASSESSMENT AND PLAN / ED COURSE  As part of my medical decision making, I reviewed the following data within the electronic  MEDICAL RECORD NUMBER Nursing notes reviewed and incorporated, Radiograph reviewed and Notes from prior ED visits     Terry Hayes was evaluated in Emergency Department on 03/30/2019 for the symptoms described in the history of present illness. He was evaluated in the context of the global COVID-19 pandemic, which necessitated consideration that the patient might be at risk for infection with the SARS-CoV-2 virus that causes COVID-19. Institutional protocols and algorithms that pertain to the evaluation of patients at risk for COVID-19 are in a state of rapid change based on information released by regulatory bodies including the CDC and federal and state organizations. These policies and algorithms were followed during the patient's care in the ED.    39 year old male presenting with right shoulder and right fourth digit injury.  X-rays demonstrate no osseous injury.  Patient declines sling or analgesia.  Will follow up with orthopedics as needed.  Strict return precautions given.  Patient verbalizes understanding agrees with plan of care.      ____________________________________________   FINAL CLINICAL IMPRESSION(S) / ED DIAGNOSES  Final diagnoses:  Acute pain of right shoulder  Sprain of right shoulder, unspecified shoulder sprain type, initial encounter  Finger injury, right,  initial encounter     ED Discharge Orders    None       Note:  This document was prepared using Dragon voice recognition software and may include unintentional dictation errors.   Irean Hong, MD 03/30/19 [phone]

## 2021-11-21 IMAGING — CR DG SHOULDER 2+V*R*
3 series · 3 of 3 positions shown · non-contrast
Comparison: None.

CLINICAL DATA: Right shoulder injury

EXAM:
RIGHT SHOULDER - 2+ VIEW

[shoulder grashey]
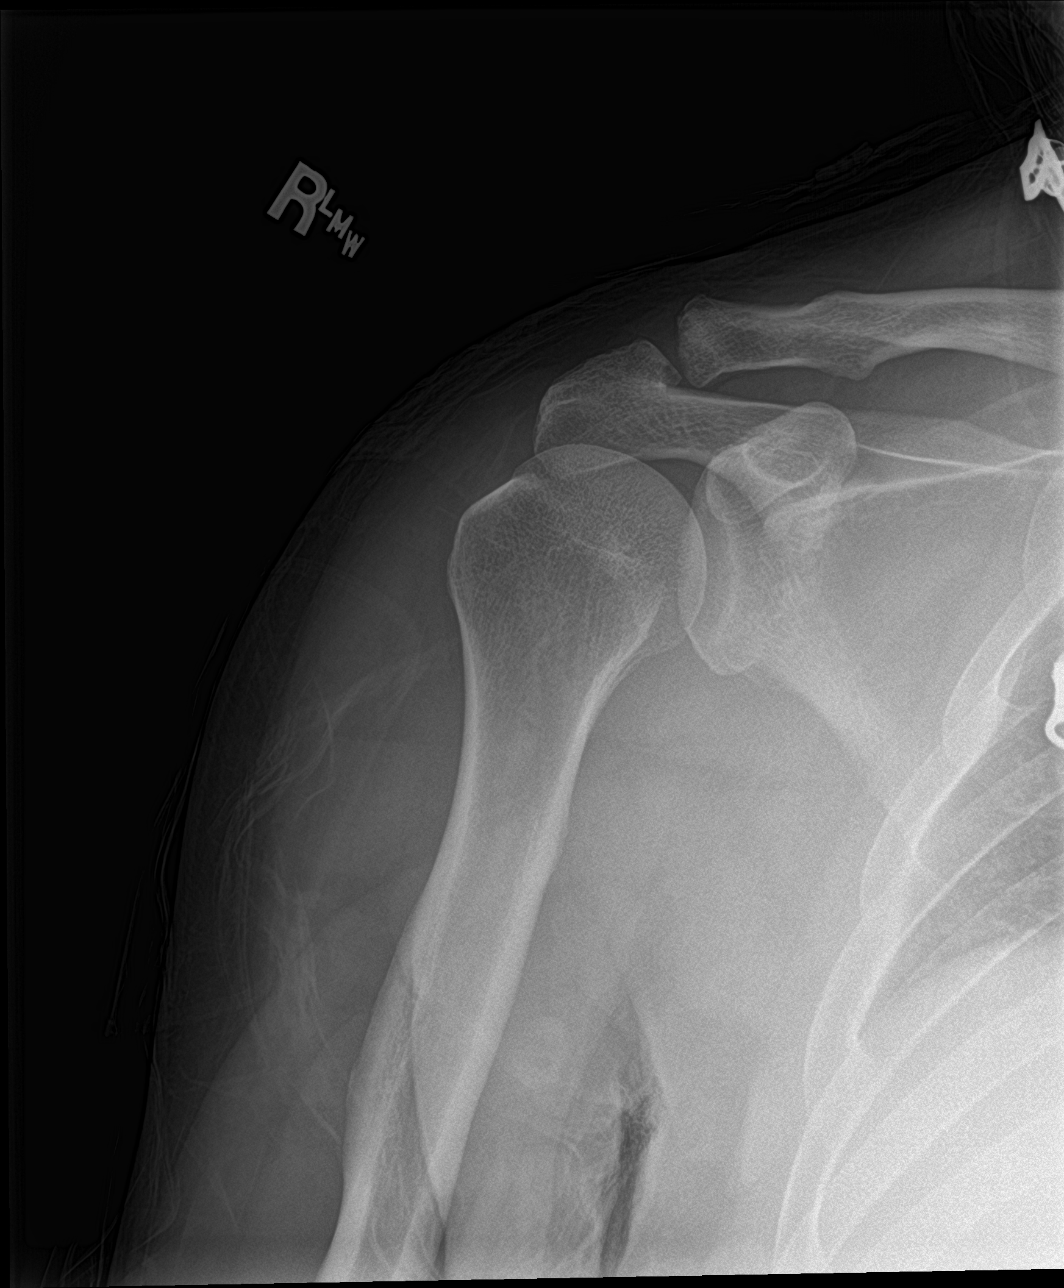

[shoulder y view]
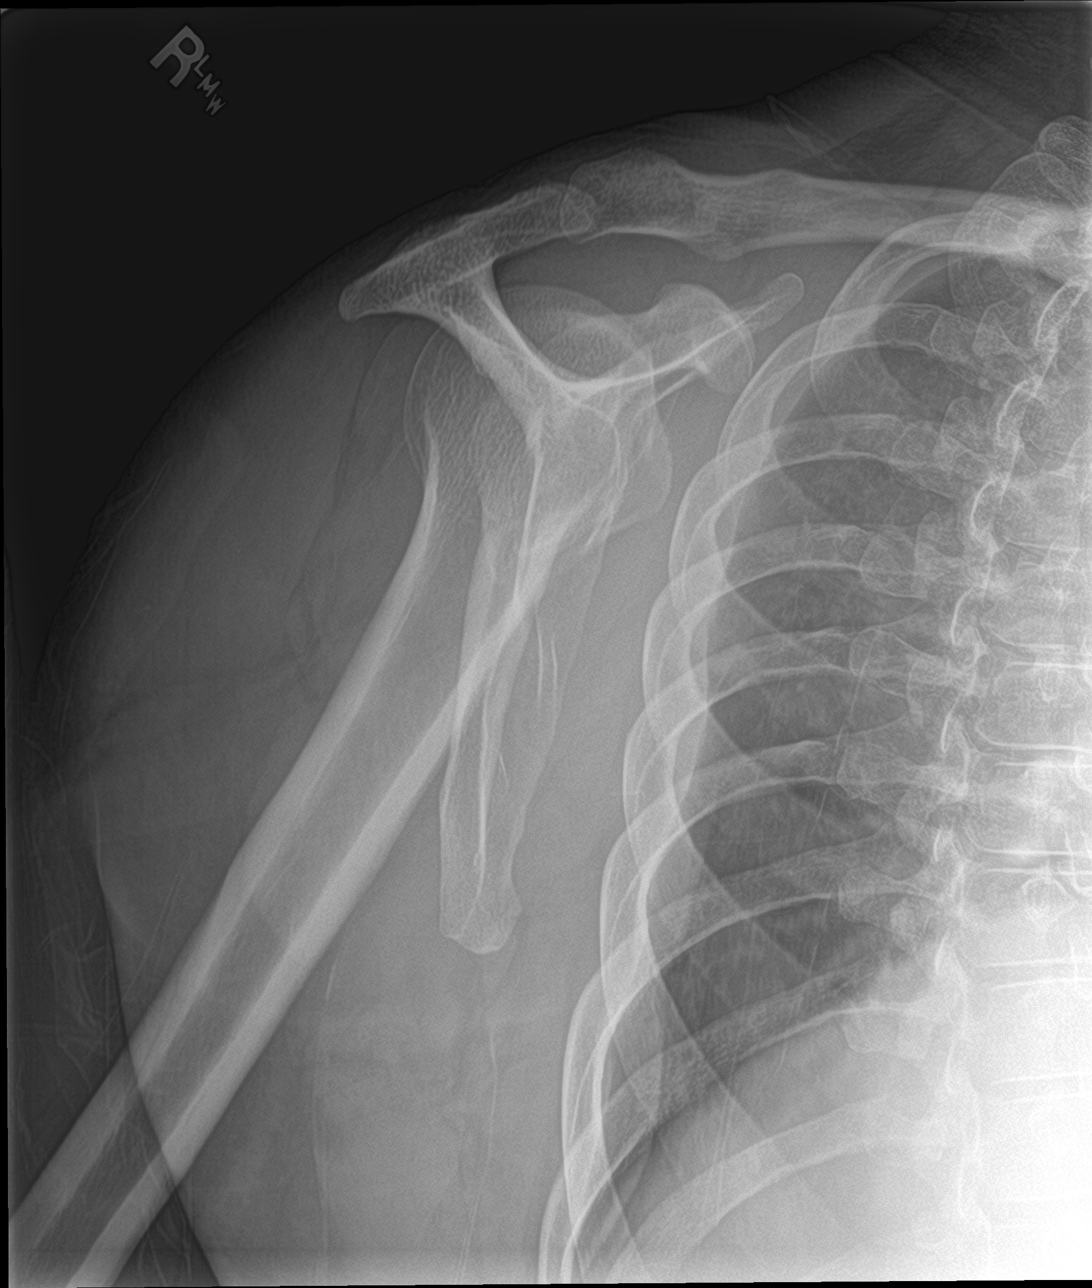

[shoulder axillary]
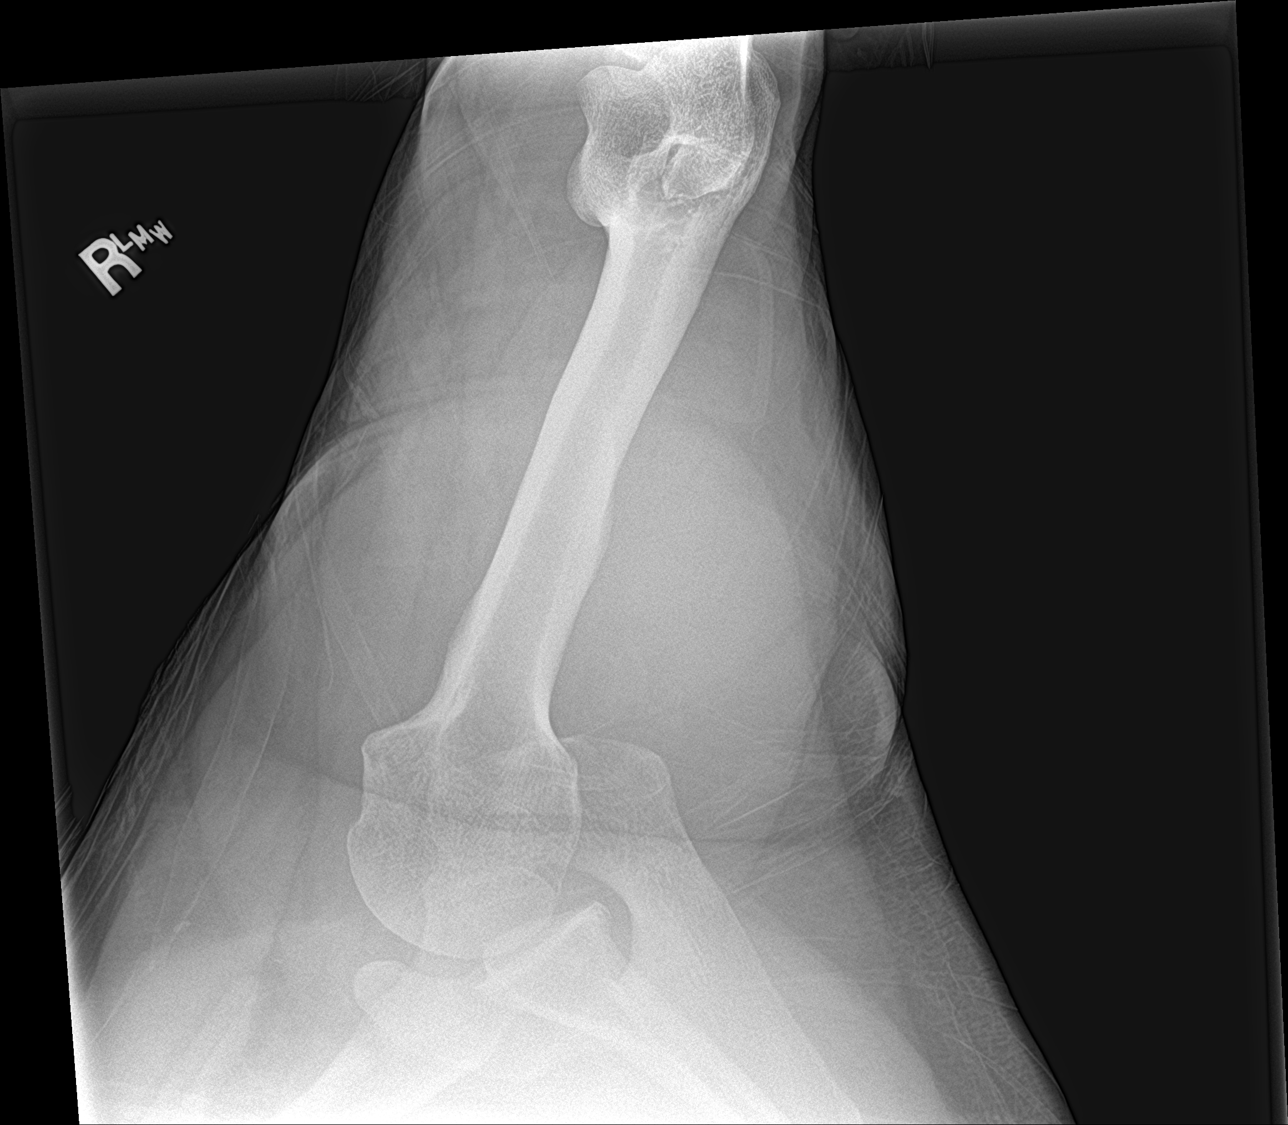

[3 of 3 positions shown; findings below may reference images not displayed]

FINDINGS: Frontal, transscapular, and axillary views of the right shoulder are
obtained. Alignment is anatomic. No fractures. Joint spaces are well
preserved. Right chest is clear.
IMPRESSION: 1. Unremarkable right shoulder.
